# Patient Record
Sex: Female | Born: 1980 | Marital: Married | Smoking: Never smoker
Health system: Southern US, Community
[De-identification: ages and names within clinical notes are randomized; demographics above are authoritative.]

## PROBLEM LIST (undated history)

## (undated) DIAGNOSIS — J45909 Unspecified asthma, uncomplicated: Secondary | ICD-10-CM

## (undated) DIAGNOSIS — Z8619 Personal history of other infectious and parasitic diseases: Secondary | ICD-10-CM

## (undated) HISTORY — DX: Personal history of other infectious and parasitic diseases: Z86.19

---

## 2009-01-29 ENCOUNTER — Observation Stay: Payer: Self-pay | Admitting: Obstetrics and Gynecology

## 2009-01-30 ENCOUNTER — Emergency Department: Payer: Self-pay | Admitting: Emergency Medicine

## 2009-02-05 ENCOUNTER — Ambulatory Visit: Payer: Self-pay | Admitting: Family Medicine

## 2009-02-19 ENCOUNTER — Encounter: Payer: Self-pay | Admitting: Obstetrics and Gynecology

## 2009-03-05 ENCOUNTER — Encounter: Payer: Self-pay | Admitting: Obstetrics & Gynecology

## 2009-03-19 ENCOUNTER — Encounter: Payer: Self-pay | Admitting: Obstetrics and Gynecology

## 2009-03-26 ENCOUNTER — Other Ambulatory Visit: Payer: Self-pay | Admitting: Obstetrics & Gynecology

## 2009-06-05 ENCOUNTER — Inpatient Hospital Stay: Payer: Self-pay

## 2010-12-02 IMAGING — US ULTRAOUND OB LIMITED - NRPT [facility]
1 series · 11 of 11 positions shown · non-contrast
Comparison: none

[Series 1: ultraound ob limited - [facility]pt [facility] · 11 of 11 slices shown]
[im 1/11]
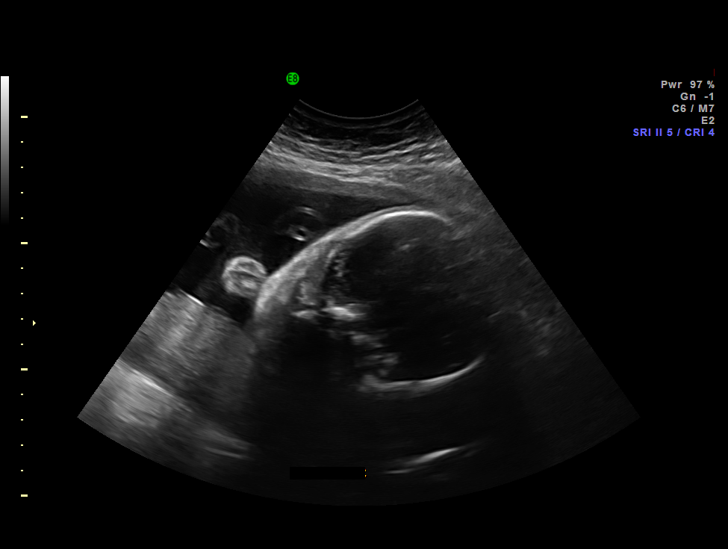
[im 2/11]
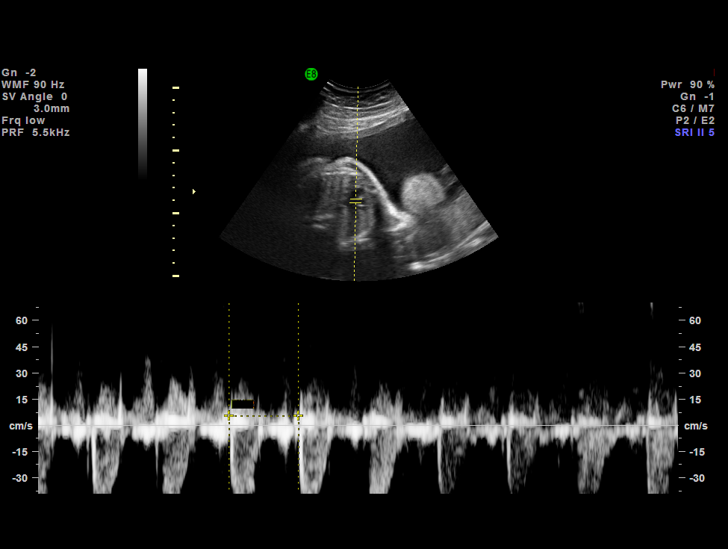
[im 3/11]
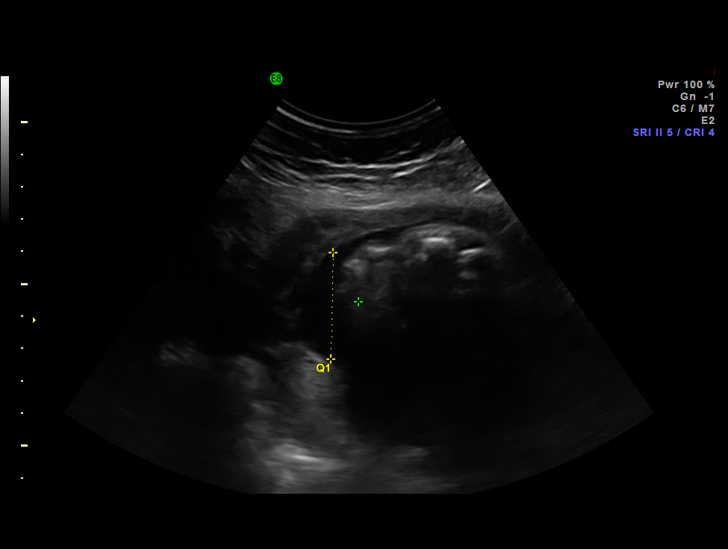
[im 4/11]
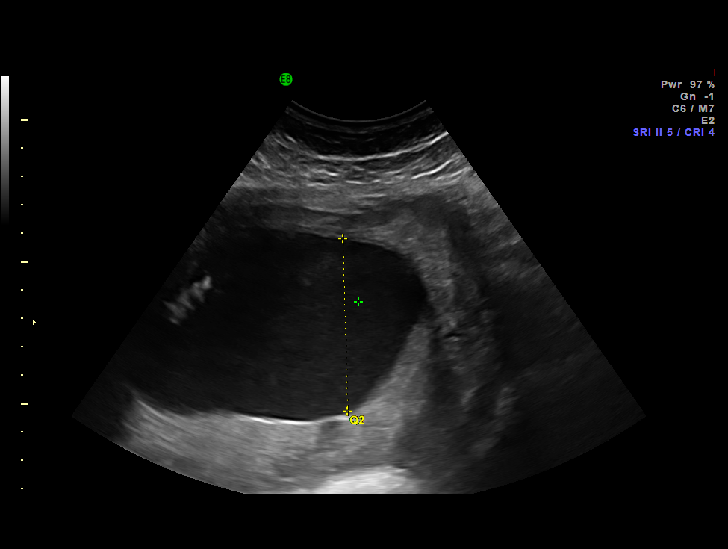
[im 5/11]
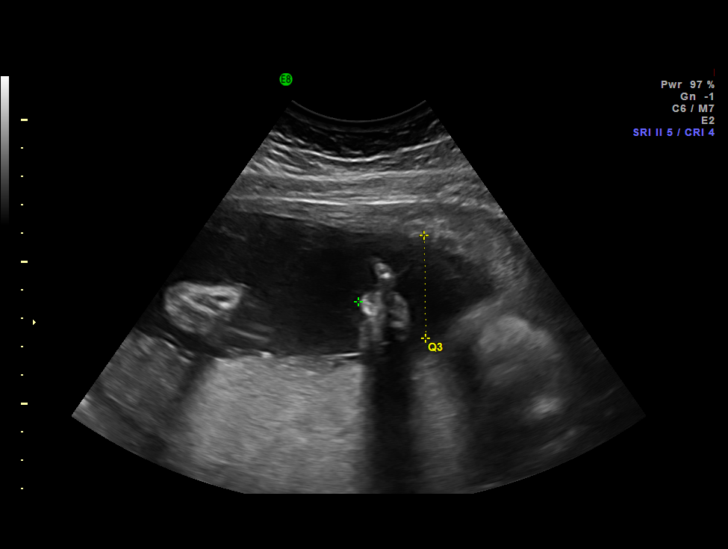
[im 6/11]
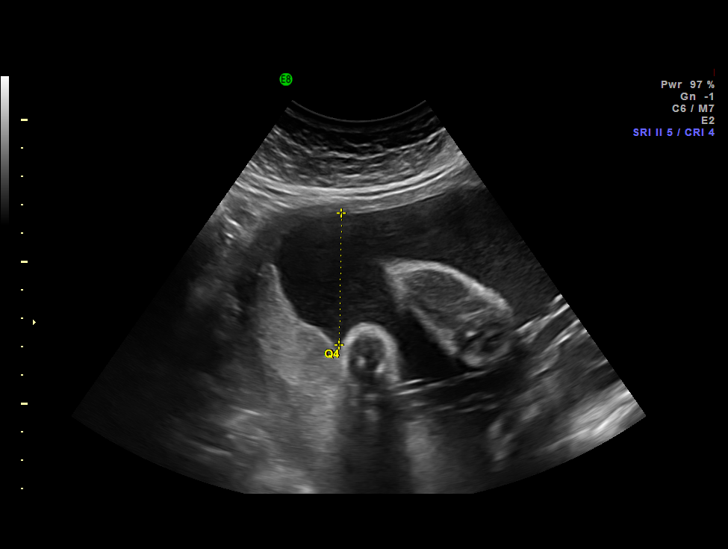
[im 7/11]
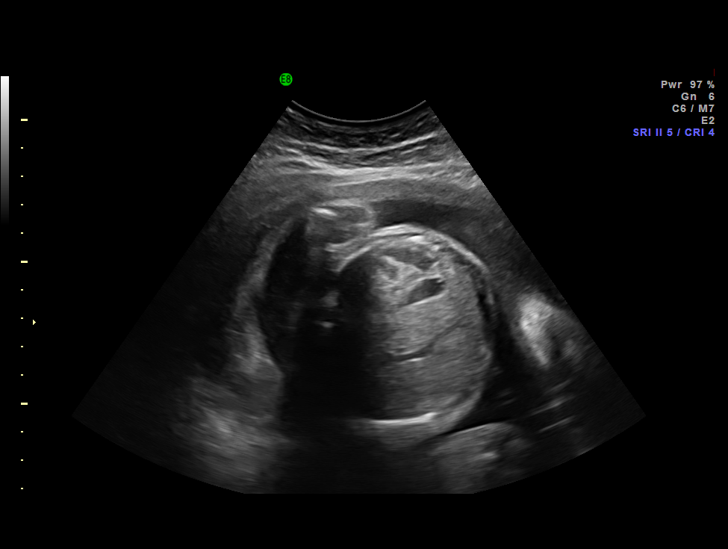
[im 8/11]
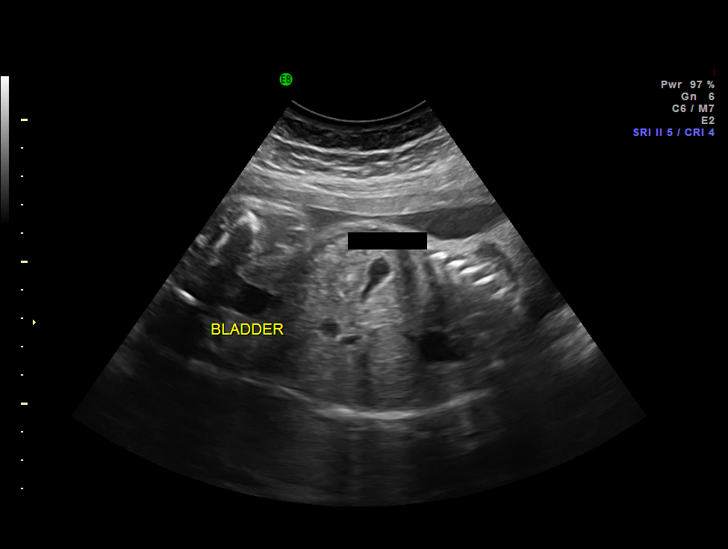
[im 9/11]
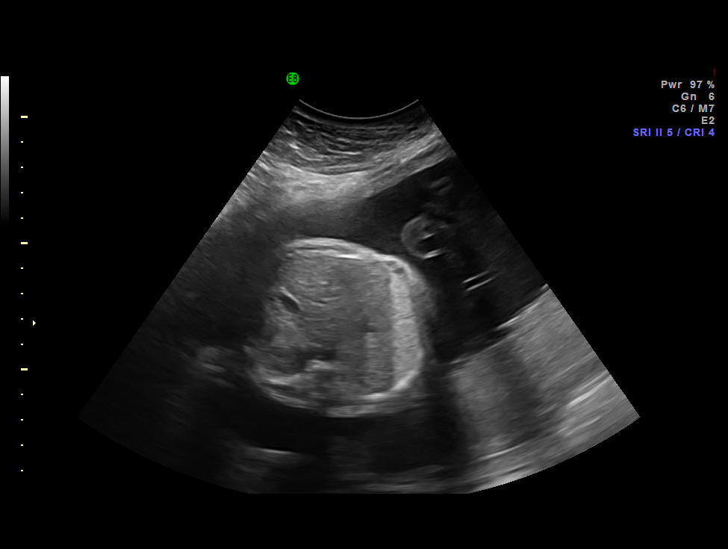
[im 10/11]
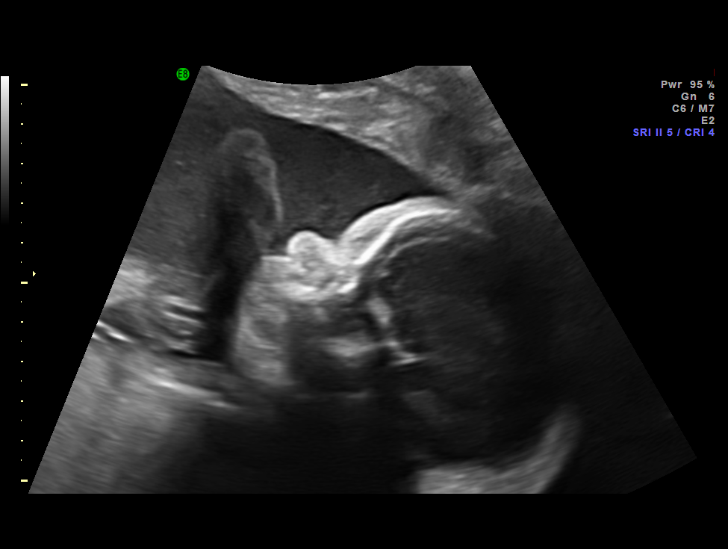
[im 11/11]
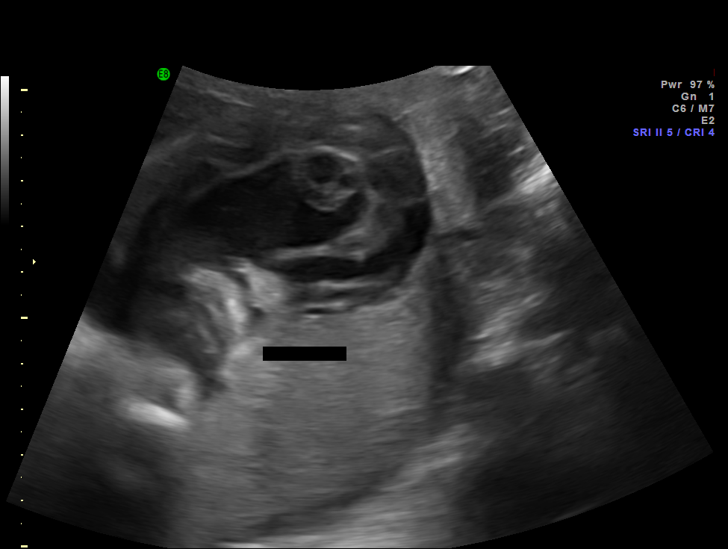

[11 of 11 positions shown; findings below may reference images not displayed]

IMAGES IMPORTED FROM THE [employer] WORKFLOW SYSTEM
NO DICTATION FOR STUDY

## 2015-06-21 ENCOUNTER — Other Ambulatory Visit: Payer: Self-pay | Admitting: Family Medicine

## 2015-07-22 ENCOUNTER — Other Ambulatory Visit: Payer: Self-pay | Admitting: Family Medicine

## 2015-10-30 ENCOUNTER — Ambulatory Visit (INDEPENDENT_AMBULATORY_CARE_PROVIDER_SITE_OTHER): Payer: No Typology Code available for payment source | Admitting: Family Medicine

## 2015-10-30 ENCOUNTER — Encounter: Payer: Self-pay | Admitting: Emergency Medicine

## 2015-10-30 ENCOUNTER — Encounter: Payer: Self-pay | Admitting: Family Medicine

## 2015-10-30 VITALS — BP 138/88 | HR 108 | Temp 98.2°F | Resp 18 | Wt 199.0 lb

## 2015-10-30 DIAGNOSIS — J309 Allergic rhinitis, unspecified: Secondary | ICD-10-CM | POA: Insufficient documentation

## 2015-10-30 DIAGNOSIS — R5383 Other fatigue: Secondary | ICD-10-CM

## 2015-10-30 DIAGNOSIS — R1013 Epigastric pain: Secondary | ICD-10-CM

## 2015-10-30 DIAGNOSIS — R55 Syncope and collapse: Secondary | ICD-10-CM | POA: Diagnosis not present

## 2015-10-30 DIAGNOSIS — J3089 Other allergic rhinitis: Secondary | ICD-10-CM

## 2015-10-30 DIAGNOSIS — J45909 Unspecified asthma, uncomplicated: Secondary | ICD-10-CM | POA: Insufficient documentation

## 2015-10-30 MED ORDER — OMEPRAZOLE 20 MG PO CPDR: 20.0000 mg | DELAYED_RELEASE_CAPSULE | Freq: Every day | ORAL | Status: DC

## 2015-10-30 MED ORDER — FLUTICASONE PROPIONATE 50 MCG/ACT NA SUSP: 2.0000 | Freq: Every day | NASAL | Status: DC

## 2015-10-30 NOTE — Progress Notes (Signed)
Patient ID: Olivia Maddox, female   DOB: 04-13-1981, 34 y.o.   MRN: [id number]       Patient: Olivia Maddox Female    DOB: 10/26/1981   34 y.o.   MRN: [id number] Visit Date: 10/30/2015  Today's Provider: Mila Merry, MD   Chief Complaint  Patient presents with  . Dizziness    Near syncope episode 2 days ago.  . Abdominal Pain   Subjective:    HPI Pt reports that this past Sunday she was at a Holiday party when she suddenly felt nauseous and nearly passed out. She was not completely unscience, she could hear people talking and could see ans talk to them. She was very dizzy and could not stand up. She has since been having pain in her upper abdomen. She reports that she feels like she has acid reflux going up her throat. Denies any chest pain or shortness of breath. After the Episode she had a headache but does not have one now. She states she had been taking OTC cold and flu medications for the past few weeks, and took 2 benadryl before going to the party on the day this episode.  Her husband also states she had not eaten nor had much to drink prior to the episode.   Epigastric pain She states she has been having intermittent epigastric and left upper quadrant pain for several months. She also gets a frequent acid taste in her moth with burning in her chest that is relieved with OTC antacids, which she takes most days     Not on File Previous Medications   FLUTICASONE PROPIONATE, INHAL, (FLOVENT DISKUS) 250 MCG/BLIST AEPB    Inhale into the lungs 2 (two) times daily. Reported on 10/30/2015   VENTOLIN HFA 108 (90 BASE) MCG/ACT INHALER    INHALE TWO PUFFS BY MOUTH EVERY 4 TO 6 HOURS AS NEEDED FOR WHEEZING    Review of Systems  Constitutional: Positive for fatigue.  Eyes: Negative.   Respiratory: Negative.   Cardiovascular: Negative.   Gastrointestinal: Positive for nausea and abdominal pain.  Endocrine: Negative.   Genitourinary: Negative.   Musculoskeletal:  Negative.   Skin: Negative.   Allergic/Immunologic: Negative.   Neurological: Positive for dizziness, syncope (pre-syncope), weakness and headaches.  Hematological: Negative.   Psychiatric/Behavioral: Negative.     Social History  Substance Use Topics  . Smoking status: Never Smoker   . Smokeless tobacco: Not on file  . Alcohol Use: No   Objective:   BP 138/88 mmHg  Pulse 108  Temp(Src) 98.2 F (36.8 C) (Oral)  Resp 18  Wt 199 lb (90.266 kg)  SpO2 99%  Physical Exam   General Appearance:    Alert, cooperative, no distress  Eyes:    PERRL, conjunctiva/corneas clear, EOM's intact       Lungs:     Clear to auscultation bilaterally, respirations unlabored  Heart:    Regular rate and rhythm. No murmurs. No carotid bruits.   Neurologic:   Awake, alert, oriented x 3. No apparent focal neurological           defect.   Abd:     Mild epigastric tenderness, and tenderness just or left of epigastrium No masses. Normal bowel sounds.       Assessment & Plan:     1. Near syncope Suspect this was multifactorial and likely exacerbated by taking benadryl when she had not had anything to ear or drink for several hours.  - EKG 12-Lead  2.  Other fatigue  - Comprehensive metabolic panel - CBC - TSH  3. Other allergic rhinitis Is to stop taking OTC antihistamines and start steroid nasal spray - fluticasone (FLONASE) 50 MCG/ACT nasal spray; Place 2 sprays into both nostrils daily.  Dispense: 16 g; Refill: 6  4. Epigastric pain Chronic and getting worse. Suspect gastritis or duodenitis.  - omeprazole (PRILOSEC) 20 MG capsule; Take 1 capsule (20 mg total) by mouth daily. For acid reflux  Dispense: 30 capsule; Refill: 2 - Amylase - H Pylori, IGM, IGG, IGA AB       Mila Merryonald Lyndy Russman, MD  [facility]

## 2015-10-31 ENCOUNTER — Encounter: Payer: Self-pay | Admitting: Family Medicine

## 2015-11-01 ENCOUNTER — Telehealth: Payer: Self-pay | Admitting: *Deleted

## 2015-11-01 LAB — H PYLORI, IGM, IGG, IGA AB
H PYLORI IGG: 5.7 U/mL — AB (ref 0.0–0.8)
H pylori, IgM Abs: <9 units (ref 0.0–8.9)
H. pylori, IgA Abs: 15.1 units — ABNORMAL HIGH (ref 0.0–8.9)

## 2015-11-01 LAB — CBC
HEMATOCRIT: 36.7 % (ref 34.0–46.6)
Hemoglobin: 12.7 g/dL (ref 11.1–15.9)
MCH: 27.7 pg (ref 26.6–33.0)
MCHC: 34.6 g/dL (ref 31.5–35.7)
MCV: 80 fL (ref 79–97)
PLATELETS: 353 10*3/uL (ref 150–379)
RBC: 4.58 x10E6/uL (ref 3.77–5.28)
RDW: 13.8 % (ref 12.3–15.4)
WBC: 11.3 10*3/uL — AB (ref 3.4–10.8)

## 2015-11-01 LAB — COMPREHENSIVE METABOLIC PANEL
A/G RATIO: 1.1 (ref 1.1–2.5)
ALBUMIN: 4.1 g/dL (ref 3.5–5.5)
ALK PHOS: 115 IU/L (ref 39–117)
ALT: 12 IU/L (ref 0–32)
AST: 16 IU/L (ref 0–40)
BILIRUBIN TOTAL: 0.3 mg/dL (ref 0.0–1.2)
BUN / CREAT RATIO: 15 (ref 8–20)
BUN: 8 mg/dL (ref 6–20)
CHLORIDE: 96 mmol/L (ref 96–106)
CO2: 20 mmol/L (ref 18–29)
Calcium: 9.7 mg/dL (ref 8.7–10.2)
Creatinine, Ser: 0.52 mg/dL — ABNORMAL LOW (ref 0.57–1.00)
GFR calc non Af Amer: 125 mL/min/{1.73_m2} (ref >59)
GFR, EST AFRICAN AMERICAN: 144 mL/min/{1.73_m2} (ref >59)
GLOBULIN, TOTAL: 3.9 g/dL (ref 1.5–4.5)
Glucose: 97 mg/dL (ref 65–99)
Potassium: 4.2 mmol/L (ref 3.5–5.2)
SODIUM: 135 mmol/L (ref 134–144)
TOTAL PROTEIN: 8 g/dL (ref 6.0–8.5)

## 2015-11-01 LAB — AMYLASE: Amylase: 51 U/L (ref 31–124)

## 2015-11-01 LAB — TSH: TSH: 1.26 u[IU]/mL (ref 0.450–4.500)

## 2015-11-01 MED ORDER — CLARITHROMYCIN 500 MG PO TABS: 500.0000 mg | ORAL_TABLET | Freq: Two times a day (BID) | ORAL | Status: DC

## 2015-11-01 MED ORDER — AMOXICILLIN 500 MG PO CAPS: 1000.0000 mg | ORAL_CAPSULE | Freq: Two times a day (BID) | ORAL | Status: DC

## 2015-11-01 NOTE — Telephone Encounter (Signed)
Pt is returning call.  MV#[phone]/[id number]/MW

## 2015-11-01 NOTE — Telephone Encounter (Signed)
Patient was notified of results. Patient expressed understanding. Rx's were sent to pharmacy. 

## 2015-11-01 NOTE — Telephone Encounter (Signed)
-----   Message from Malva Limesonald E Fisher, MD sent at 11/01/2015  7:41 AM EST ----- Labs indicated she has h.pylori which is probably the cause of her stomach pains. Otherwise labs are normal. Need to start amoxicillin 500mg  2 twice a day (#40),  Clarithromycin 500mg  1 twice a day (#10), and take the omeprazole 20mg  that was prescribed at [facility]. TWICE a day instead of once a day.  Need follow up o.v. In 2 weeks.  I think the episode where she almost passed out was a combination of the benadryl and low blood sugar and she should avoid taking benadryl for the time being.

## 2015-11-27 ENCOUNTER — Ambulatory Visit
Admission: RE | Admit: 2015-11-27 | Discharge: 2015-11-27 | Disposition: A | Discharge status: Home / Self Care | Payer: BLUE CROSS/BLUE SHIELD | Source: Ambulatory Visit | Attending: Family Medicine | Admitting: Family Medicine

## 2015-11-27 ENCOUNTER — Ambulatory Visit (INDEPENDENT_AMBULATORY_CARE_PROVIDER_SITE_OTHER): Payer: BLUE CROSS/BLUE SHIELD | Admitting: Family Medicine

## 2015-11-27 VITALS — BP 120/86 | HR 100 | Temp 97.8°F | Resp 18 | Wt 195.0 lb

## 2015-11-27 DIAGNOSIS — M542 Cervicalgia: Secondary | ICD-10-CM | POA: Insufficient documentation

## 2015-11-27 DIAGNOSIS — S0302XA Dislocation of jaw, left side, initial encounter: Secondary | ICD-10-CM

## 2015-11-27 DIAGNOSIS — B9681 Helicobacter pylori [H. pylori] as the cause of diseases classified elsewhere: Secondary | ICD-10-CM | POA: Insufficient documentation

## 2015-11-27 DIAGNOSIS — S0300XA Dislocation of jaw, unspecified side, initial encounter: Secondary | ICD-10-CM | POA: Insufficient documentation

## 2015-11-27 DIAGNOSIS — K2970 Gastritis, unspecified, without bleeding: Principal | ICD-10-CM

## 2015-11-27 MED ORDER — CELECOXIB 100 MG PO CAPS: 100.0000 mg | ORAL_CAPSULE | Freq: Two times a day (BID) | ORAL | Status: DC | PRN

## 2015-11-27 NOTE — Progress Notes (Signed)
       Patient: Olivia Maddox Female    DOB: 03/01/1981   34 y.o.   MRN: [id number] Visit Date: 11/27/2015  Today's Provider: Mila Merry, MD   Chief Complaint  Patient presents with  . Follow-up    H. Pylori infection follow up   Subjective:    HPI H. Pylori infection follow up:  Patient was last seen 10/30/2015. Patient came in for symptoms of epigastric pain. Labs were ordered showing positive for H. Pylori infection. Patient was treated with Amoxicillin, Clarithromycin and Omeprazole.  Today patient comes in stating she has completed all doses of medication. She reports the epigastric pain has resolved but she still has heart burn.   Patient has also been having nack pain and jaw pain, and feels a click in her right jaw when she chews gum.       Not on File Previous Medications   FLUTICASONE (FLONASE) 50 MCG/ACT NASAL SPRAY    Place 2 sprays into both nostrils daily.   FLUTICASONE PROPIONATE, INHAL, (FLOVENT DISKUS) 250 MCG/BLIST AEPB    Inhale into the lungs 2 (two) times daily. Reported on 10/30/2015   IBUPROFEN (ADVIL,MOTRIN) 200 MG TABLET    Take 200 mg by mouth every 6 (six) hours as needed.   OMEPRAZOLE (PRILOSEC) 20 MG CAPSULE    Take 1 capsule (20 mg total) by mouth daily. For acid reflux   VENTOLIN HFA 108 (90 BASE) MCG/ACT INHALER    INHALE TWO PUFFS BY MOUTH EVERY 4 TO 6 HOURS AS NEEDED FOR WHEEZING    Review of Systems  Constitutional: Negative for fever, chills, appetite change and fatigue.  HENT: Negative for ear discharge.   Respiratory: Negative for chest tightness and shortness of breath.   Cardiovascular: Negative for chest pain and palpitations.  Gastrointestinal: Negative for nausea, vomiting and abdominal pain.       Reflux  Musculoskeletal: Positive for neck pain.  Neurological: Positive for headaches. Negative for dizziness and weakness.    Social History  Substance Use Topics  . Smoking status: Never Smoker   . Smokeless tobacco: Not  on file  . Alcohol Use: No   Objective:   BP 120/86 mmHg  Pulse 100  Temp(Src) 97.8 F (36.6 C) (Oral)  Resp 18  Wt 195 lb (88.451 kg)  SpO2 100%  Physical Exam  General appearance: alert, well developed, well nourished, cooperative and in no distress Head: Normocephalic, without obvious abnormality, atraumatic. FROM of neck  Lungs: Respirations even and unlabored Extremities: No gross deformities. Abd: Non tender no masses.      Assessment & Plan:     1. Helicobacter pylori gastritis Finished triple therapy. Will d TOC - H. pylori breath test  2. Neck pain Having some worsening reflyx symptoms on ibuprofen. Will try COX-2 inhibitor. - DG Cervical Spine Complete; Future - celecoxib (CELEBREX) 100 MG capsule; Take 1 capsule (100 mg total) by mouth 2 (two) times daily as needed. For neck pain  Dispense: 30 capsule; Refill: 1  3. TMJ (dislocation of temporomandibular joint), initial encounter         Mila Merry, MD  [facility]

## 2015-11-29 ENCOUNTER — Ambulatory Visit
Admission: RE | Admit: 2015-11-29 | Discharge: 2015-11-29 | Disposition: A | Discharge status: Home / Self Care | Payer: BLUE CROSS/BLUE SHIELD | Source: Ambulatory Visit | Attending: Family Medicine | Admitting: Family Medicine

## 2015-11-29 DIAGNOSIS — M542 Cervicalgia: Secondary | ICD-10-CM | POA: Diagnosis present

## 2015-11-30 ENCOUNTER — Telehealth: Payer: Self-pay | Admitting: Family Medicine

## 2015-11-30 NOTE — Telephone Encounter (Signed)
Patient advised of results and verbally voiced understanding.  

## 2015-11-30 NOTE — Telephone Encounter (Signed)
Pt calling wanting to know about her X-ray Results from yesterday (11-29-15), pt states she would like a nurse to return her call @ [phone].  Thanks CC

## 2015-12-01 LAB — H. PYLORI BREATH TEST: H. pylori UBiT: POSITIVE — AB

## 2015-12-03 ENCOUNTER — Telehealth: Payer: Self-pay

## 2015-12-03 MED ORDER — METRONIDAZOLE 250 MG PO TABS: 250.0000 mg | ORAL_TABLET | Freq: Four times a day (QID) | ORAL | Status: DC

## 2015-12-03 MED ORDER — BISMUTH SUBSALICYLATE 262 MG PO TABS: 2.0000 | ORAL_TABLET | Freq: Four times a day (QID) | ORAL | Status: DC

## 2015-12-03 MED ORDER — OMEPRAZOLE MAGNESIUM 20 MG PO TBEC: 20.0000 mg | DELAYED_RELEASE_TABLET | Freq: Every day | ORAL | Status: DC

## 2015-12-03 MED ORDER — DOXYCYCLINE HYCLATE 100 MG PO CAPS: 100.0000 mg | ORAL_CAPSULE | Freq: Two times a day (BID) | ORAL | Status: DC

## 2015-12-03 NOTE — Telephone Encounter (Signed)
Unable to leave VM due to not being set up. Will try again later.

## 2015-12-03 NOTE — Telephone Encounter (Signed)
-----   Message from Malva Limes, MD sent at 12/02/2015  9:27 PM EST ----- H. Pylori test is positive. Need to take a more aggressive treatment: RX metronidazole  four times a day for 14 days #56 OTC Pepto Bismol  2 tablets four times a day for 14 day Rx doxycycline  twice a day for 14 days And Prilosec OTC  twice a day for 14 day Follow up 2-3 weeks to retest.  RX  doxycyline  twice a day. OTC peptobismo tablets 2 tablet four times a day and Prilosec  twice a day

## 2015-12-03 NOTE — Telephone Encounter (Signed)
Patient advised as below. Meds sent into pharmacy. Patient will call back to make appt due to husbands schedule.

## 2015-12-24 ENCOUNTER — Other Ambulatory Visit: Payer: Self-pay | Admitting: Family Medicine

## 2015-12-24 NOTE — Telephone Encounter (Signed)
Pt needs refill VENTOLIN HFA 108 (90 Base) MCG/ACT inhaler  [facility] [address]  Thanks, Barth Kirks

## 2015-12-24 NOTE — Telephone Encounter (Signed)
This rx has already been sent.

## 2015-12-27 ENCOUNTER — Ambulatory Visit (INDEPENDENT_AMBULATORY_CARE_PROVIDER_SITE_OTHER): Payer: BLUE CROSS/BLUE SHIELD | Admitting: Family Medicine

## 2015-12-27 ENCOUNTER — Encounter: Payer: Self-pay | Admitting: Family Medicine

## 2015-12-27 VITALS — BP 126/90 | HR 91 | Temp 97.6°F | Resp 16 | Wt 192.0 lb

## 2015-12-27 DIAGNOSIS — M542 Cervicalgia: Secondary | ICD-10-CM | POA: Diagnosis not present

## 2015-12-27 DIAGNOSIS — K219 Gastro-esophageal reflux disease without esophagitis: Secondary | ICD-10-CM | POA: Diagnosis not present

## 2015-12-27 DIAGNOSIS — H9311 Tinnitus, right ear: Secondary | ICD-10-CM

## 2015-12-27 DIAGNOSIS — K2970 Gastritis, unspecified, without bleeding: Secondary | ICD-10-CM

## 2015-12-27 DIAGNOSIS — J45909 Unspecified asthma, uncomplicated: Secondary | ICD-10-CM

## 2015-12-27 DIAGNOSIS — B9681 Helicobacter pylori [H. pylori] as the cause of diseases classified elsewhere: Secondary | ICD-10-CM | POA: Diagnosis not present

## 2015-12-27 MED ORDER — NEOMYCIN-POLYMYXIN-HC 3.5-10000-1 OT SOLN: 3.0000 [drp] | Freq: Four times a day (QID) | OTIC | Status: AC

## 2015-12-27 MED ORDER — FLUTICASONE PROPIONATE (INHAL) 250 MCG/BLIST IN AEPB: 2.0000 | INHALATION_SPRAY | Freq: Two times a day (BID) | RESPIRATORY_TRACT | Status: DC

## 2015-12-27 NOTE — Progress Notes (Signed)
Patient: Olivia Maddox Female    DOB: 08/31/1981   34 y.o.   MRN: [id number] Visit Date: 12/27/2015  Today's Provider: Mila Merry, MD   Chief Complaint  Patient presents with  . Follow-up    H. Pylori infection   Subjective:    HPI Follow up H. Pylori infection:  Last office visit was 11/27/2015. H. Pylori test was done and still tested positive. Patient was started on a more aggressive treatment; Metronidazole, OTC Pepto bismol, Doxycycline and Prilosec. Patient is here today to follow up and retest.  Patient feels that her symptoms have improved but she still has nausea.    Neck pain She had normal xrays in January and was prescribed Celebrex. Due to insurance she just got prescription and started on Celebrex yesterday. 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   Asthma She has been having to use albuterol most days for the last few months. She has been of her maintenance inhaler which she does not like to take because she doesn't like to take medications. '  Tinnitus She reports frequent ringing in her ears, worse on the right, for a long time. She asks if she needs an MRI to have this evaluated. She also complains if frequent itching and irritation in her ears, but no ear pain.     No Known Allergies Previous Medications   CELECOXIB (CELEBREX) 100 MG CAPSULE    Take 1 capsule (100 mg total) by mouth 2 (two) times daily as needed. For neck pain   FLUTICASONE (FLONASE) 50 MCG/ACT NASAL SPRAY    Place 2 sprays into both nostrils daily.   FLUTICASONE PROPIONATE, INHAL, (FLOVENT DISKUS) 250 MCG/BLIST AEPB    Inhale into the lungs 2 (two) times daily. Reported on 10/30/2015   VENTOLIN HFA 108 (90 BASE) MCG/ACT INHALER    INHALE 2 PUFFS EVERY 4 TO 6 HOURS AS NEEDED FOR WHEEZING    Review of Systems  Constitutional: Negative for fever, chills, appetite change and fatigue.  HENT: Positive for ear pain (has pain and itching in  both ears; worse in right ear).   Respiratory: Negative for chest tightness and shortness of breath.   Cardiovascular: Negative for chest pain and palpitations.  Gastrointestinal: Positive for nausea. Negative for vomiting and abdominal pain.  Neurological: Negative for dizziness and weakness.    Social History  Substance Use Topics  . Smoking status: Never Smoker   . Smokeless tobacco: Not on file  . Alcohol Use: No   Objective:   BP 126/90 mmHg  Pulse 91  Temp(Src) 97.6 F (36.4 C) (Oral)  Resp 16  Wt 192 lb (87.091 kg)  SpO2 99%  LMP 11/28/2015  Physical Exam  General Appearance:    Alert, cooperative, no distress  HENT:   Ear canals coated with dried wax. TMs normal. No bleeding  Lungs:   CTA       Assessment & Plan:     1. Helicobacter pylori gastritis Failed first line treatment of amoxicillin, clarithromycin and omeprazole. Has now completed second line therapy with metronidazole, doxy, pepto-bismo, and omeprazole. She is still a little nauseated after eating, but also describes some symptoms more c/w reflux. Recheck h. Pylori breast test. Consider getting back on PPI due to GERD symptoms.   2. Tinnitus of right ear  - Ambulatory referral to ENT  3. Neck pain Normal xray c-spine. Just started Celebrex in the last  24 hours. No OTC NSAIDs due to GI symptoms. Consider PT if not improving on Celebrex  4. Gastroesophageal reflux disease without esophagitis   5. Asthma, unspecified asthma severity, uncomplicated Counseled on importance of using maintenance inhaler Start back on Flovent and continue prn albuterol.   - Fluticasone Propionate, Inhal, (FLOVENT DISKUS) 250 MCG/BLIST AEPB; Inhale 2 puffs into the lungs 2 (two) times daily. Reported on 10/30/2015  Dispense: 60 each; Refill: 5        Mila Merry, MD  [facility]

## 2015-12-27 NOTE — Patient Instructions (Addendum)
   Use a cervical pillow when sleeping to help support your neck.   Call if your neck is not feeling better after taking the Celebrex twice a day for the next week.

## 2015-12-30 LAB — H. PYLORI BREATH TEST: H. PYLORI UBIT: NEGATIVE

## 2015-12-31 ENCOUNTER — Telehealth: Payer: Self-pay

## 2015-12-31 ENCOUNTER — Other Ambulatory Visit: Payer: Self-pay | Admitting: Family Medicine

## 2015-12-31 NOTE — Telephone Encounter (Signed)
-----   Message from Malva Limes, MD sent at 12/30/2015  9:04 PM EST ----- H. Pylori test is negative. No more antibiotics are needed.

## 2015-12-31 NOTE — Telephone Encounter (Signed)
Tried calling patient, and VM has not been set up yet. Will try again later.

## 2016-01-01 ENCOUNTER — Telehealth: Payer: Self-pay | Admitting: Family Medicine

## 2016-01-01 NOTE — Telephone Encounter (Signed)
Pt is wanting to get results from H Pylori test

## 2016-01-01 NOTE — Telephone Encounter (Signed)
Patient was notified of results. Patient expressed understanding. 

## 2016-01-01 NOTE — Telephone Encounter (Signed)
Patient returned call. Tried calling patient, and VM has not been set up yet. Will try again later.

## 2016-01-01 NOTE — Telephone Encounter (Signed)
See other message

## 2016-01-15 ENCOUNTER — Other Ambulatory Visit: Payer: Self-pay | Admitting: Otolaryngology

## 2016-01-15 DIAGNOSIS — H9201 Otalgia, right ear: Secondary | ICD-10-CM

## 2016-01-15 DIAGNOSIS — M542 Cervicalgia: Secondary | ICD-10-CM

## 2016-01-18 ENCOUNTER — Ambulatory Visit: Admission: RE | Admit: 2016-01-18 | Payer: BLUE CROSS/BLUE SHIELD | Source: Ambulatory Visit

## 2016-04-08 ENCOUNTER — Other Ambulatory Visit: Payer: Self-pay | Admitting: Obstetrics and Gynecology

## 2016-04-08 DIAGNOSIS — Z369 Encounter for antenatal screening, unspecified: Secondary | ICD-10-CM

## 2016-04-08 LAB — OB RESULTS CONSOLE VARICELLA ZOSTER ANTIBODY, IGG: VARICELLA IGG: IMMUNE

## 2016-04-08 LAB — OB RESULTS CONSOLE HIV ANTIBODY (ROUTINE TESTING): HIV: NONREACTIVE

## 2016-04-08 LAB — OB RESULTS CONSOLE RUBELLA ANTIBODY, IGM: RUBELLA: IMMUNE

## 2016-04-08 LAB — OB RESULTS CONSOLE HEPATITIS B SURFACE ANTIGEN: Hepatitis B Surface Ag: NEGATIVE

## 2016-04-14 ENCOUNTER — Ambulatory Visit (HOSPITAL_BASED_OUTPATIENT_CLINIC_OR_DEPARTMENT_OTHER)
Admission: RE | Admit: 2016-04-14 | Discharge: 2016-04-14 | Disposition: A | Discharge status: Home / Self Care | Payer: BLUE CROSS/BLUE SHIELD | Source: Ambulatory Visit | Attending: Obstetrics & Gynecology | Admitting: Obstetrics & Gynecology

## 2016-04-14 ENCOUNTER — Ambulatory Visit
Admission: RE | Admit: 2016-04-14 | Discharge: 2016-04-14 | Disposition: A | Discharge status: Home / Self Care | Payer: BLUE CROSS/BLUE SHIELD | Source: Ambulatory Visit | Attending: Obstetrics & Gynecology | Admitting: Obstetrics & Gynecology

## 2016-04-14 VITALS — HR 107 | Temp 98.4°F | Resp 17 | Ht 69.0 in | Wt 190.0 lb

## 2016-04-14 DIAGNOSIS — Z36 Encounter for antenatal screening of mother: Secondary | ICD-10-CM | POA: Diagnosis present

## 2016-04-14 DIAGNOSIS — Z3A12 12 weeks gestation of pregnancy: Secondary | ICD-10-CM | POA: Insufficient documentation

## 2016-04-14 DIAGNOSIS — O09511 Supervision of elderly primigravida, first trimester: Secondary | ICD-10-CM | POA: Insufficient documentation

## 2016-04-14 DIAGNOSIS — O09521 Supervision of elderly multigravida, first trimester: Secondary | ICD-10-CM

## 2016-04-14 DIAGNOSIS — Z369 Encounter for antenatal screening, unspecified: Secondary | ICD-10-CM

## 2016-04-14 DIAGNOSIS — O09529 Supervision of elderly multigravida, unspecified trimester: Secondary | ICD-10-CM | POA: Insufficient documentation

## 2016-04-14 HISTORY — DX: Unspecified asthma, uncomplicated: J45.909

## 2016-04-14 NOTE — Progress Notes (Signed)
I have reviewed the note and agree with the findings and plan.  Janeann ForehandS. Laakea Pereira, MD

## 2016-04-14 NOTE — Progress Notes (Signed)
[facility] Length of Consultation: 45 minutes  Olivia Maddox [facility] because of advanced maternal age.  The patient will be 35 years old at the time of delivery.  This note summarizes the information we discussed.    We explained that the chance of a chromosome abnormality increases with maternal age.  Chromosomes and examples of chromosome problems were reviewed.  Humans typically have 46 chromosomes in each cell, with half passed through each sperm and egg.  Any change in the number or structure of chromosomes can increase the risk of problems in the physical and mental development of a pregnancy.   Based upon age of the patient and the current gestational age, the chance of any chromosome abnormality was 1 in 68114. The chance of Down syndrome, the most common chromosome problem associated with maternal age, was 1 in 31238.  The risk of chromosome problems is in addition to the 3% general population risk for birth defects and mental retardation.  The greatest chance, of course, is that the baby would be born in good health.  We discussed the following prenatal screening and testing options for this pregnancy:  First trimester screening, which includes nuchal translucency ultrasound screen and first trimester maternal serum marker screening.  The nuchal translucency has approximately an 80% detection rate for Down syndrome and can be positive for other chromosome abnormalities as well as heart defects.  When combined with a maternal serum marker screening, the detection rate is up to 90% for Down syndrome and up to 97% for trisomy 18.     The chorionic villus sampling procedure is available for first trimester chromosome analysis.  This involves the withdrawal of a small amount of chorionic villi (tissue from the developing placenta).  Risk of pregnancy loss is estimated to be approximately 1 in 200 to 1 in  100 (0.5 to 1%).  There is approximately a 1% (1 in 100) chance that the CVS chromosome results will be unclear.  Chorionic villi cannot be tested for neural tube defects.     Maternal serum marker screening, a blood test that measures pregnancy proteins, can provide risk assessments for Down syndrome, trisomy 18, and open neural tube defects (spina bifida, anencephaly). Because it does not directly examine the fetus, it cannot positively diagnose or rule out these problems.  Targeted ultrasound uses high frequency sound waves to create an image of the developing fetus.  An ultrasound is often recommended as a routine means of evaluating the pregnancy.  It is also used to screen for fetal anatomy problems (for example, a heart defect) that might be suggestive of a chromosomal or other abnormality.   Amniocentesis involves the removal of a small amount of amniotic fluid from the sac surrounding the fetus with the use of a thin needle inserted through the maternal abdomen and uterus.  Ultrasound guidance is used throughout the procedure.  Fetal cells from amniotic fluid are directly evaluated and > 99.5% of chromosome problems and > 98% of open neural tube defects can be detected. This procedure is generally performed after the 15th week of pregnancy.  The main risks to this procedure include complications leading to miscarriage in less than 1 in 200 cases (0.5%).  We also reviewed the availability of cell free fetal DNA testing from maternal blood to determine whether or not the baby may have either Down syndrome, trisomy 3713, or trisomy 2118.  This test utilizes a maternal  blood sample and DNA sequencing technology to isolate circulating cell free fetal DNA from maternal plasma.  The fetal DNA can then be analyzed for DNA sequences that are derived from the three most common chromosomes involved in aneuploidy, chromosomes 13, 18, and 21.  If the overall amount of DNA is greater than the expected level for any  of these chromosomes, aneuploidy is suspected.  While we do not consider it a replacement for invasive testing and karyotype analysis, a negative result from this testing would be reassuring, though not a guarantee of a normal chromosome complement for the baby.  An abnormal result is certainly suggestive of an abnormal chromosome complement, though we would still recommend CVS or amniocentesis to confirm any findings from this testing.  Cystic Fibrosis and Spinal Muscular Atrophy (SMA) screening were also discussed with the patient. Both conditions are recessive, which means that both parents must be carriers in order to have a child with the disease.  Cystic fibrosis (CF) is one of the most common genetic conditions in persons of Caucasian ancestry.  This condition occurs in approximately 1 in 2,500 Caucasian persons and results in thickened secretions in the lungs, digestive, and reproductive systems.  For a baby to be at risk for having CF, both of the parents must be carriers for this condition.  Approximately 1 in 60 Caucasian persons is a carrier for CF.  Current carrier testing looks for the most common mutations in the gene for CF and can detect approximately 90% of carriers in the Caucasian population.  This means that the carrier screening can greatly reduce, but cannot eliminate, the chance for an individual to have a child with CF.  If an individual is found to be a carrier for CF, then carrier testing would be available for the partner. As part of [place]'s newborn screening profile, all babies born in the state of [place] will have a two-tier screening process.  Specimens are first tested to determine the concentration of immunoreactive trypsinogen (IRT).  The top 5% of specimens with the highest IRT values then undergo DNA testing using a panel of over 40 common CF mutations. SMA is a neurodegenerative disorder that leads to atrophy of skeletal muscle and overall weakness.  This  condition is also more prevalent in the Caucasian population, with 1 in 40-1 in 60 persons being a carrier and 1 in 6,000-1 in 10,000 children being affected.  There are multiple forms of the disease, with some causing death in infancy to other forms with survival into adulthood.  The genetics of SMA is complex, but carrier screening can detect up to 95% of carriers in the Caucasian population.  Similar to CF, a negative result can greatly reduce, but cannot eliminate, the chance to have a child with SMA.  We obtained a detailed family history and pregnancy history.  The father of the baby is 61 years old and has a personal and family history of type 2 diabetes.  We discussed the multifactorial inheritance of diabetes. We also reviewed that advanced paternal age is known to be associated with an increased chance for several fetal health conditions.  There is known to be an increase in single gene abnormalities in the children of men who are over age 31-50, though the specific age varies in different studies.  This chance is estimated to be no greater than 2%. These mutations may result in birth defects or genetic syndromes which may show differences on ultrasound in the second trimester.  Therefore, we recommend a detailed anatomy ultrasound at approximately [redacted] weeks gestation. A normal ultrasound cannot rule out these disorders. Advanced paternal age has also been associated with other conditions including autism spectrum disorders, schizophrenia and childhood acute lymphoblastic leukemia.  It is important to be aware of these associations, though no clinical testing is available for these conditions at this time.The remainder of the family history is unremarkable for birth defects, mental retardation, recurrent pregnancy loss or known chromosome abnormalities.  Ms. Olivia Hitch stated that this is her second pregnancy.  She and her husband have a healthy 20 year old daughter.  She reported no complications or  exposures in this pregnancy that would be expected to increase the risk for birth defects.  A review of her prenatal labs showed that she has a normal MCV, which greatly reduces the chance for her to be a carrier of thalassemia.  However, it cannot detect other hemoglobin variant including sickle cell trait.  Given her ancestry, a hemoglobin electrophoresis could be considered if desired, though we did review that it is included in newborn screening.  After consideration of the options, Olivia Maddox elected to proceed with an ultrasound only and to decline CF, SMA and hemoglobinopathy testing.  An ultrasound was performed at the time of the visit.  The gestational age was consistent with  12 weeks.  Fetal anatomy could not be assessed due to early gestational age.  Please refer to the ultrasound report for details of that study.  Ms. Olivia Hitch was encouraged to call with questions or concerns.  We can be contacted at [phone].    Cherly Anderson, MS, CGC

## 2016-04-14 NOTE — Progress Notes (Signed)
Patient refused having her blood pressure taken.  Patient states that when her blood pressure is taken her heart races and her anxiety level increases.  Dr. Tyler DeisWheeler aware and discussed with the patient.

## 2016-07-28 ENCOUNTER — Other Ambulatory Visit: Payer: Self-pay | Admitting: Family Medicine

## 2016-07-28 DIAGNOSIS — J3089 Other allergic rhinitis: Secondary | ICD-10-CM

## 2016-07-28 MED ORDER — FLUTICASONE PROPIONATE 50 MCG/ACT NA SUSP
2.0000 | Freq: Every day | NASAL | 6 refills | Status: DC
Start: 2016-07-28 — End: 2023-07-29

## 2016-07-28 NOTE — Telephone Encounter (Signed)
Pt's husband contacted office for refill request on the following medications:  1. VENTOLIN HFA 108 (90 Base) MCG/ACT inhaler  Last written: 12/24/15 with 5 refills  2. fluticasone (FLONASE) 50 MCG/ACT nasal spray Last written: 10/30/15 with 6 refills   [facility] [address]  Last OV: 12/27/15 Husband was advised Dr. Sherrie MustacheFisher is out of the office until 08/04/16 and another provider would review the request. Please advise. Thanks TNP

## 2016-08-01 ENCOUNTER — Other Ambulatory Visit: Payer: Self-pay

## 2016-08-01 DIAGNOSIS — J45909 Unspecified asthma, uncomplicated: Secondary | ICD-10-CM

## 2016-08-01 MED ORDER — FLUTICASONE PROPIONATE (INHAL) 250 MCG/BLIST IN AEPB: 2.0000 | INHALATION_SPRAY | Freq: Two times a day (BID) | RESPIRATORY_TRACT | 5 refills | Status: DC

## 2016-08-21 ENCOUNTER — Other Ambulatory Visit: Payer: Self-pay | Admitting: Family Medicine

## 2016-08-21 DIAGNOSIS — J45909 Unspecified asthma, uncomplicated: Secondary | ICD-10-CM

## 2016-08-21 NOTE — Progress Notes (Signed)
Please advise patient her insurance no longer covers flovent. Instead, they cover Qvar 40mcg 2 puffs BID. May send in rx for 1 inhaler with 5 refills after advising patient.    09/01/2016- Patients husband was advised of this in person and prescription has been sent into the pharmacy. Percell Locus-R. Chambers, CMA

## 2016-08-26 MED ORDER — BECLOMETHASONE DIPROPIONATE 40 MCG/ACT IN AERS: 2.0000 | INHALATION_SPRAY | Freq: Two times a day (BID) | RESPIRATORY_TRACT | 5 refills | Status: DC

## 2016-10-03 LAB — OB RESULTS CONSOLE GBS: GBS: NEGATIVE

## 2016-10-03 LAB — OB RESULTS CONSOLE GC/CHLAMYDIA
Chlamydia: NEGATIVE
Gonorrhea: NEGATIVE

## 2016-10-23 ENCOUNTER — Inpatient Hospital Stay
Admission: RE | Admit: 2016-10-23 | Payer: Medicaid Other | Source: Ambulatory Visit | Admitting: Obstetrics and Gynecology

## 2016-10-23 ENCOUNTER — Other Ambulatory Visit: Payer: Self-pay | Admitting: Obstetrics and Gynecology

## 2016-10-23 ENCOUNTER — Inpatient Hospital Stay
Admission: EM | Admit: 2016-10-23 | Discharge: 2016-10-27 | DRG: 765 | Disposition: A | Discharge status: Home / Self Care | Payer: Medicaid Other | Attending: Obstetrics and Gynecology | Admitting: Obstetrics and Gynecology

## 2016-10-23 ENCOUNTER — Encounter: Payer: Self-pay | Admitting: *Deleted

## 2016-10-23 DIAGNOSIS — K219 Gastro-esophageal reflux disease without esophagitis: Secondary | ICD-10-CM | POA: Diagnosis present

## 2016-10-23 DIAGNOSIS — J45909 Unspecified asthma, uncomplicated: Secondary | ICD-10-CM | POA: Diagnosis present

## 2016-10-23 DIAGNOSIS — O4103X0 Oligohydramnios, third trimester, not applicable or unspecified: Secondary | ICD-10-CM | POA: Diagnosis present

## 2016-10-23 DIAGNOSIS — O321XX0 Maternal care for breech presentation, not applicable or unspecified: Secondary | ICD-10-CM | POA: Diagnosis present

## 2016-10-23 DIAGNOSIS — O1002 Pre-existing essential hypertension complicating childbirth: Secondary | ICD-10-CM | POA: Diagnosis present

## 2016-10-23 DIAGNOSIS — O99344 Other mental disorders complicating childbirth: Secondary | ICD-10-CM | POA: Diagnosis present

## 2016-10-23 DIAGNOSIS — D62 Acute posthemorrhagic anemia: Secondary | ICD-10-CM | POA: Diagnosis not present

## 2016-10-23 DIAGNOSIS — Z8249 Family history of ischemic heart disease and other diseases of the circulatory system: Secondary | ICD-10-CM

## 2016-10-23 DIAGNOSIS — O9081 Anemia of the puerperium: Secondary | ICD-10-CM | POA: Diagnosis not present

## 2016-10-23 DIAGNOSIS — O9952 Diseases of the respiratory system complicating childbirth: Secondary | ICD-10-CM | POA: Diagnosis present

## 2016-10-23 DIAGNOSIS — Z3A39 39 weeks gestation of pregnancy: Secondary | ICD-10-CM | POA: Diagnosis not present

## 2016-10-23 DIAGNOSIS — F419 Anxiety disorder, unspecified: Secondary | ICD-10-CM | POA: Diagnosis present

## 2016-10-23 DIAGNOSIS — Z3493 Encounter for supervision of normal pregnancy, unspecified, third trimester: Secondary | ICD-10-CM

## 2016-10-23 DIAGNOSIS — O9962 Diseases of the digestive system complicating childbirth: Secondary | ICD-10-CM | POA: Diagnosis present

## 2016-10-23 LAB — CBC
HEMATOCRIT: 33.6 % — AB (ref 35.0–47.0)
HEMOGLOBIN: 11.5 g/dL — AB (ref 12.0–16.0)
MCH: 28 pg (ref 26.0–34.0)
MCHC: 34.4 g/dL (ref 32.0–36.0)
MCV: 81.6 fL (ref 80.0–100.0)
Platelets: 183 10*3/uL (ref 150–440)
RBC: 4.12 MIL/uL (ref 3.80–5.20)
RDW: 16.6 % — ABNORMAL HIGH (ref 11.5–14.5)
WBC: 9.2 10*3/uL (ref 3.6–11.0)

## 2016-10-23 LAB — BASIC METABOLIC PANEL
ANION GAP: 7 (ref 5–15)
BUN: 6 mg/dL (ref 6–20)
CALCIUM: 8.7 mg/dL — AB (ref 8.9–10.3)
CO2: 22 mmol/L (ref 22–32)
Chloride: 106 mmol/L (ref 101–111)
Creatinine, Ser: 0.43 mg/dL — ABNORMAL LOW (ref 0.44–1.00)
GLUCOSE: 83 mg/dL (ref 65–99)
POTASSIUM: 3.7 mmol/L (ref 3.5–5.1)
Sodium: 135 mmol/L (ref 135–145)

## 2016-10-23 LAB — TYPE AND SCREEN
ABO/RH(D): A POS
ANTIBODY SCREEN: NEGATIVE

## 2016-10-23 MED ORDER — ACETAMINOPHEN 650 MG RE SUPP
650.0000 mg | Freq: Once | RECTAL | Status: AC
  Administered 2016-10-24: 650 mg via RECTAL
  Filled 2016-10-23 (×3): qty 1

## 2016-10-23 MED ORDER — LACTATED RINGERS IV SOLN
INTRAVENOUS | Status: DC
  Administered 2016-10-23 (×2): 1000 mL via INTRAVENOUS

## 2016-10-23 MED ORDER — SODIUM CHLORIDE 0.9% FLUSH: 3.0000 mL | Freq: Two times a day (BID) | INTRAVENOUS | Status: DC

## 2016-10-23 MED ORDER — BUPIVACAINE LIPOSOME 1.3 % IJ SUSP
20.0000 mL | Freq: Once | INTRAMUSCULAR | Status: DC
  Filled 2016-10-23 (×4): qty 20

## 2016-10-23 MED ORDER — SOD CITRATE-CITRIC ACID 500-334 MG/5ML PO SOLN
ORAL | Status: AC
  Administered 2016-10-23: 30 mL
  Filled 2016-10-23: qty 15

## 2016-10-23 MED ORDER — CEFAZOLIN SODIUM-DEXTROSE 2-4 GM/100ML-% IV SOLN
2.0000 g | INTRAVENOUS | Status: AC
  Administered 2016-10-24: 2 g via INTRAVENOUS
  Filled 2016-10-23 (×2): qty 100

## 2016-10-23 MED ORDER — ACETAMINOPHEN 325 MG PO TABS
ORAL_TABLET | ORAL | Status: AC
  Filled 2016-10-23: qty 2

## 2016-10-23 MED ORDER — LACTATED RINGERS IV SOLN
INTRAVENOUS | Status: DC
  Administered 2016-10-24: 05:00:00 via INTRAVENOUS

## 2016-10-23 MED ORDER — SALINE SPRAY 0.65 % NA SOLN
1.0000 | NASAL | Status: DC | PRN
  Filled 2016-10-23: qty 44

## 2016-10-23 MED ORDER — BUPIVACAINE HCL 0.5 % IJ SOLN
30.0000 mL | Freq: Once | INTRAMUSCULAR | Status: DC
  Filled 2016-10-23 (×3): qty 30

## 2016-10-23 MED ORDER — ACETAMINOPHEN 325 MG PO TABS
650.0000 mg | ORAL_TABLET | Freq: Four times a day (QID) | ORAL | Status: DC | PRN
  Administered 2016-10-23: 650 mg via ORAL

## 2016-10-23 MED ORDER — SODIUM CHLORIDE 0.9 % IV SOLN: INTRAVENOUS | Status: DC

## 2016-10-23 MED ORDER — SODIUM CHLORIDE FLUSH 0.9 % IV SOLN
INTRAVENOUS | Status: AC
  Filled 2016-10-23: qty 10

## 2016-10-23 NOTE — Progress Notes (Signed)
Pt alert with family in room. Pt desired info on Ad. CH provided document but Pt did not wish to complete document now. CH is available to explain.   10/23/16 1605  Clinical Encounter Type  Visited With Patient and family together  Visit Type Initial  Referral From Nurse  Spiritual Encounters  Spiritual Needs Literature  Stress Factors  Patient Stress Factors None identified  Family Stress Factors None identified

## 2016-10-23 NOTE — H&P (Signed)
OB ADMISSION/ HISTORY & PHYSICAL:  Admission Date: 10/23/2016 12:25 PM  Admit Diagnosis: Primary Cesarean Section for breech presentation and oligohydramnios at 39+6 weeks   Olivia Maddox is a 35 y.o. female presenting for a scheduled Primary Cesarean Section for breech presentation and oligohydramnios at 39+6 weeks.  She was scheduled for a C/S on 12/26, but diagnosed with oligohydramnios on 10/22/70 with a MVP 1.7.   Prenatal History: G2P1   EDC : 10/24/2016, by Last Menstrual Period  [facility]  Prenatal course complicated by:  - History of SGA baby 2157w3d delivery, weight 2575 g (5lbs11oz),  - Anxiety  - History of Idiopathic polyhydramnios, per TJS needs monthly growth scan and weekly NST starting at 28wk - GHTN: Likely cHTN with elevated BP prior to 20wks. Not high enough for meds, but goal will be 140/90 if needed. Plan for fetal surveillance in 3rd trimester with AFI/NSTs and growth scans. Started ASA at 19 weeks with BP of 137/87; U/S 09/23/16 efw 2439 ( 26%) AFI 214mm - Breech 10/23/16 - Oligohydramnios 10/23/16  - Flu in season - declined 08/11/16 - Tdap at 27-36 weeks - given 08/11/16  Prenatal Labs: ABO, Rh: --/--/A POS (12/21 1240) Antibody: PENDING (12/21 1240) Rubella:   Immune Varicella: Immune RPR:   NR HBsAg:   Negative HIV:   Negative GTT: 82 GBS:   Negative  Medical / Surgical History :  Past medical history:  Past Medical History:  Diagnosis Date  . Asthma   . History of chicken pox      Past surgical history: No past surgical history on file.  Family History:  Family History  Problem Relation Age of Onset  . Heart disease Father   . Heart disease Brother      Social History:  reports that she has never smoked. She has never used smokeless tobacco. She reports that she does not drink alcohol or use drugs.   Allergies: Patient has no known allergies.    Current Medications at time of admission:  Prior to  Admission medications   Medication Sig Start Date End Date Taking? Authorizing Provider  beclomethasone (QVAR) 40 MCG/ACT inhaler Inhale 2 puffs into the lungs 2 (two) times daily. 08/26/16   Malva Limesonald E Fisher, MD  celecoxib (CELEBREX) 100 MG capsule Take 1 capsule (100 mg total) by mouth 2 (two) times daily as needed. For neck pain Patient not taking: Reported on 04/14/2016 11/27/15   Malva Limesonald E Fisher, MD  fluticasone [facility](FLONASE) 50 MCG/ACT nasal spray Place 2 sprays into both nostrils daily. 07/28/16   Malva Limesonald E Fisher, MD  omeprazole (PRILOSEC) 20 MG capsule TAKE 1 TABLET (20 MG TOTAL) BY MOUTH DAILY. Patient not taking: Reported on 04/14/2016 12/31/15   Malva Limesonald E Fisher, MD  VENTOLIN HFA 108 [phone](90 Base) MCG/ACT inhaler INHALE 2 PUFFS EVERY 4 TO 6 HOURS AS NEEDED FOR WHEEZING 07/28/16   Malva Limesonald E Fisher, MD     Review of Systems: Active FM BH ctxs No LOF  / SROM  No bloody show    Physical Exam:  VS: Temperature 98.2 F (36.8 C), temperature source Oral, resp. rate 20, height 5\' 9"  (1.753 m), weight 88.5 kg (195 lb), last menstrual period 01/18/2016.  General: alert and oriented, appears anxious  Heart: RRR Lungs: Clear lung fields Abdomen: Gravid, soft and non-tender, non-distended / uterus: gravid, non-tender Breech by Leopold's - confirmed breech with US by myself and Tammy Hall  Extremities: no edema  Genitalia / VE:  deferred  FHR: baseline rate 130 bpm / variability moderate / accelerations + / no decelerations TOCO: quiet    Assessment: 39+[redacted] weeks gestation Breech Oligohydramnios  FHR category 1   Plan:  1. Admit to [facility] Place for scheduled Primary LTCS for Breech presentation and oligohydramnios      - Pre-op orders in      - Ancef 2 grams prior to C/S BMI 28      - US confirmed Breech  2. GBS negative  3. Postpartum    - Contraception: unsure    - Breast  Dr. Elesa MassedWard notified of admission / plan of care  Carlean JewsMeredith Theodor Mustin, CNM

## 2016-10-23 NOTE — Anesthesia Preprocedure Evaluation (Signed)
Anesthesia Evaluation  Patient identified by MRN, date of birth, ID band Patient awake    Reviewed: Allergy & Precautions, H&P , NPO status , Patient's Chart, lab work & pertinent test results  Airway Mallampati: III  TM Distance: >3 FB Neck ROM: full    Dental  (+) Poor Dentition, Chipped   Pulmonary neg shortness of breath, asthma ,    Pulmonary exam normal breath sounds clear to auscultation       Cardiovascular Exercise Tolerance: Good hypertension, (-) angina(-) Past MI Normal cardiovascular exam Rhythm:regular Rate:Normal     Neuro/Psych    GI/Hepatic negative GI ROS, GERD  Controlled,  Endo/Other    Renal/GU   negative genitourinary   Musculoskeletal   Abdominal   Peds  Hematology negative hematology ROS (+)   Anesthesia Other Findings Past Medical History: No date: Asthma No date: History of chicken pox  No past surgical history on file.     Reproductive/Obstetrics (+) Pregnancy                             Anesthesia Physical Anesthesia Plan  ASA: III  Anesthesia Plan: Spinal   Post-op Pain Management:    Induction:   Airway Management Planned:   Additional Equipment:   Intra-op Plan:   Post-operative Plan:   Informed Consent: I have reviewed the patients History and Physical, chart, labs and discussed the procedure including the risks, benefits and alternatives for the proposed anesthesia with the patient or authorized representative who has indicated his/her understanding and acceptance.   Dental Advisory Given  Plan Discussed with: Anesthesiologist  Anesthesia Plan Comments:         Anesthesia Quick Evaluation

## 2016-10-24 ENCOUNTER — Inpatient Hospital Stay: Payer: Medicaid Other | Admitting: Anesthesiology

## 2016-10-24 ENCOUNTER — Encounter: Payer: Self-pay | Admitting: Anesthesiology

## 2016-10-24 ENCOUNTER — Other Ambulatory Visit: Payer: BLUE CROSS/BLUE SHIELD

## 2016-10-24 ENCOUNTER — Encounter
Admission: EM | Disposition: A | Discharge status: Home / Self Care | Payer: Self-pay | Source: Home / Self Care | Attending: Obstetrics and Gynecology

## 2016-10-24 DIAGNOSIS — Z3493 Encounter for supervision of normal pregnancy, unspecified, third trimester: Secondary | ICD-10-CM

## 2016-10-24 LAB — RPR: RPR: NONREACTIVE

## 2016-10-24 SURGERY — Surgical Case
Anesthesia: Spinal

## 2016-10-24 MED ORDER — PHENYLEPHRINE 40 MCG/ML (10ML) SYRINGE FOR IV PUSH (FOR BLOOD PRESSURE SUPPORT)
PREFILLED_SYRINGE | INTRAVENOUS | Status: AC
  Filled 2016-10-24: qty 20

## 2016-10-24 MED ORDER — DIPHENHYDRAMINE HCL 25 MG PO CAPS: 25.0000 mg | ORAL_CAPSULE | ORAL | Status: DC | PRN

## 2016-10-24 MED ORDER — ACETAMINOPHEN 325 MG PO TABS: 650.0000 mg | ORAL_TABLET | ORAL | Status: DC | PRN

## 2016-10-24 MED ORDER — OXYCODONE HCL 5 MG/5ML PO SOLN: 5.0000 mg | Freq: Once | ORAL | Status: DC | PRN

## 2016-10-24 MED ORDER — MENTHOL 3 MG MT LOZG: 1.0000 | LOZENGE | OROMUCOSAL | Status: DC | PRN

## 2016-10-24 MED ORDER — ONDANSETRON HCL 4 MG/2ML IJ SOLN
INTRAMUSCULAR | Status: AC
  Filled 2016-10-24: qty 2

## 2016-10-24 MED ORDER — AZITHROMYCIN 500 MG IV SOLR
500.0000 mg | INTRAVENOUS | Status: AC
  Administered 2016-10-24: 500 mg via INTRAVENOUS
  Filled 2016-10-24: qty 500

## 2016-10-24 MED ORDER — FENTANYL CITRATE (PF) 100 MCG/2ML IJ SOLN: 25.0000 ug | INTRAMUSCULAR | Status: DC | PRN

## 2016-10-24 MED ORDER — OXYCODONE-ACETAMINOPHEN 5-325 MG PO TABS
1.0000 | ORAL_TABLET | ORAL | Status: DC | PRN
  Administered 2016-10-24 – 2016-10-27 (×9): 1 via ORAL
  Filled 2016-10-24 (×13): qty 1

## 2016-10-24 MED ORDER — ALBUTEROL SULFATE (2.5 MG/3ML) 0.083% IN NEBU
2.5000 mg | INHALATION_SOLUTION | Freq: Four times a day (QID) | RESPIRATORY_TRACT | Status: DC
  Administered 2016-10-24: 2.5 mg via RESPIRATORY_TRACT
  Filled 2016-10-24: qty 3

## 2016-10-24 MED ORDER — TETANUS-DIPHTH-ACELL PERTUSSIS 5-2.5-18.5 LF-MCG/0.5 IM SUSP: 0.5000 mL | Freq: Once | INTRAMUSCULAR | Status: DC

## 2016-10-24 MED ORDER — DIPHENHYDRAMINE HCL 25 MG PO CAPS: 25.0000 mg | ORAL_CAPSULE | Freq: Four times a day (QID) | ORAL | Status: DC | PRN

## 2016-10-24 MED ORDER — OXYTOCIN 40 UNITS IN LACTATED RINGERS INFUSION - SIMPLE MED
INTRAVENOUS | Status: AC
  Filled 2016-10-24: qty 1000

## 2016-10-24 MED ORDER — SODIUM CHLORIDE 0.9 % IJ SOLN
INTRAMUSCULAR | Status: AC
  Filled 2016-10-24: qty 50

## 2016-10-24 MED ORDER — IBUPROFEN 600 MG PO TABS
600.0000 mg | ORAL_TABLET | Freq: Four times a day (QID) | ORAL | Status: DC
  Administered 2016-10-24 – 2016-10-26 (×6): 600 mg via ORAL
  Filled 2016-10-24 (×6): qty 1

## 2016-10-24 MED ORDER — MENTHOL 3 MG MT LOZG
1.0000 | LOZENGE | OROMUCOSAL | Status: DC | PRN
  Administered 2016-10-24 (×2): 3 mg via ORAL
  Filled 2016-10-24: qty 9

## 2016-10-24 MED ORDER — KETOROLAC TROMETHAMINE 30 MG/ML IJ SOLN: 30.0000 mg | Freq: Four times a day (QID) | INTRAMUSCULAR | Status: DC | PRN

## 2016-10-24 MED ORDER — MORPHINE SULFATE (PF) 0.5 MG/ML IJ SOLN
INTRAMUSCULAR | Status: DC | PRN
  Administered 2016-10-24: .2 mg via EPIDURAL

## 2016-10-24 MED ORDER — HYDROCOD POLST-CPM POLST ER 10-8 MG/5ML PO SUER: 5.0000 mL | Freq: Two times a day (BID) | ORAL | Status: DC | PRN

## 2016-10-24 MED ORDER — PHENYLEPHRINE 40 MCG/ML (10ML) SYRINGE FOR IV PUSH (FOR BLOOD PRESSURE SUPPORT)
PREFILLED_SYRINGE | INTRAVENOUS | Status: AC
  Filled 2016-10-24: qty 10

## 2016-10-24 MED ORDER — FLUTICASONE PROPIONATE 50 MCG/ACT NA SUSP
2.0000 | Freq: Every day | NASAL | Status: DC
  Filled 2016-10-24: qty 16

## 2016-10-24 MED ORDER — NALBUPHINE HCL 10 MG/ML IJ SOLN
5.0000 mg | Freq: Once | INTRAMUSCULAR | Status: AC | PRN
  Administered 2016-10-24: 5 mg via INTRAVENOUS

## 2016-10-24 MED ORDER — SIMETHICONE 80 MG PO CHEW
80.0000 mg | CHEWABLE_TABLET | ORAL | Status: DC
  Administered 2016-10-27: 80 mg via ORAL

## 2016-10-24 MED ORDER — SIMETHICONE 80 MG PO CHEW
80.0000 mg | CHEWABLE_TABLET | Freq: Three times a day (TID) | ORAL | Status: DC
  Administered 2016-10-24 – 2016-10-26 (×7): 80 mg via ORAL
  Filled 2016-10-24 (×8): qty 1

## 2016-10-24 MED ORDER — BUDESONIDE 0.25 MG/2ML IN SUSP
0.2500 mg | Freq: Two times a day (BID) | RESPIRATORY_TRACT | Status: DC
  Filled 2016-10-24: qty 2

## 2016-10-24 MED ORDER — MEASLES, MUMPS & RUBELLA VAC ~~LOC~~ INJ
0.5000 mL | INJECTION | Freq: Once | SUBCUTANEOUS | Status: DC
  Filled 2016-10-24: qty 0.5

## 2016-10-24 MED ORDER — PROPOFOL 10 MG/ML IV BOLUS
INTRAVENOUS | Status: AC
  Filled 2016-10-24: qty 20

## 2016-10-24 MED ORDER — BUPIVACAINE IN DEXTROSE 0.75-8.25 % IT SOLN
INTRATHECAL | Status: DC | PRN
  Administered 2016-10-24: 1.6 mL via INTRATHECAL

## 2016-10-24 MED ORDER — NALBUPHINE HCL 10 MG/ML IJ SOLN
INTRAMUSCULAR | Status: AC
  Administered 2016-10-24: 5 mg via INTRAVENOUS
  Filled 2016-10-24: qty 1

## 2016-10-24 MED ORDER — DIPHENHYDRAMINE HCL 50 MG/ML IJ SOLN: 12.5000 mg | INTRAMUSCULAR | Status: DC | PRN

## 2016-10-24 MED ORDER — MEPERIDINE HCL 25 MG/ML IJ SOLN: 6.2500 mg | INTRAMUSCULAR | Status: DC | PRN

## 2016-10-24 MED ORDER — BUDESONIDE 0.5 MG/2ML IN SUSP
0.5000 mg | Freq: Two times a day (BID) | RESPIRATORY_TRACT | Status: DC
  Administered 2016-10-24 – 2016-10-27 (×6): 0.5 mg via RESPIRATORY_TRACT
  Filled 2016-10-24 (×8): qty 2

## 2016-10-24 MED ORDER — SIMETHICONE 80 MG PO CHEW: 80.0000 mg | CHEWABLE_TABLET | ORAL | Status: DC | PRN

## 2016-10-24 MED ORDER — ALBUTEROL SULFATE HFA 108 (90 BASE) MCG/ACT IN AERS
INHALATION_SPRAY | RESPIRATORY_TRACT | Status: DC | PRN
  Administered 2016-10-24: 2 via RESPIRATORY_TRACT

## 2016-10-24 MED ORDER — NALBUPHINE HCL 10 MG/ML IJ SOLN: 5.0000 mg | Freq: Once | INTRAMUSCULAR | Status: AC | PRN

## 2016-10-24 MED ORDER — OXYCODONE HCL 5 MG PO TABS: 5.0000 mg | ORAL_TABLET | Freq: Once | ORAL | Status: DC | PRN

## 2016-10-24 MED ORDER — EPHEDRINE SULFATE 50 MG/ML IJ SOLN
INTRAMUSCULAR | Status: DC | PRN
  Administered 2016-10-24 (×3): 10 mg via INTRAVENOUS

## 2016-10-24 MED ORDER — PRENATAL MULTIVITAMIN CH
1.0000 | ORAL_TABLET | Freq: Every day | ORAL | Status: DC
  Administered 2016-10-24 – 2016-10-27 (×4): 1 via ORAL
  Filled 2016-10-24 (×4): qty 1

## 2016-10-24 MED ORDER — PHENYLEPHRINE HCL 10 MG/ML IJ SOLN
INTRAMUSCULAR | Status: DC | PRN
  Administered 2016-10-24 (×4): 80 ug via INTRAVENOUS
  Administered 2016-10-24: 40 ug via INTRAVENOUS
  Administered 2016-10-24: 80 ug via INTRAVENOUS
  Administered 2016-10-24 (×2): 40 ug via INTRAVENOUS
  Administered 2016-10-24 (×4): 80 ug via INTRAVENOUS

## 2016-10-24 MED ORDER — BUPIVACAINE LIPOSOME 1.3 % IJ SUSP
INTRAMUSCULAR | Status: DC | PRN
  Administered 2016-10-24: 50 mL

## 2016-10-24 MED ORDER — COCONUT OIL OIL: 1.0000 "application " | TOPICAL_OIL | Status: DC | PRN

## 2016-10-24 MED ORDER — NALOXONE HCL 0.4 MG/ML IJ SOLN: 0.4000 mg | INTRAMUSCULAR | Status: DC | PRN

## 2016-10-24 MED ORDER — ONDANSETRON HCL 4 MG/2ML IJ SOLN: 4.0000 mg | Freq: Once | INTRAMUSCULAR | Status: DC | PRN

## 2016-10-24 MED ORDER — SCOPOLAMINE 1 MG/3DAYS TD PT72: 1.0000 | MEDICATED_PATCH | Freq: Once | TRANSDERMAL | Status: DC

## 2016-10-24 MED ORDER — EPHEDRINE 5 MG/ML INJ
INTRAVENOUS | Status: AC
  Filled 2016-10-24: qty 10

## 2016-10-24 MED ORDER — NALOXONE HCL 2 MG/2ML IJ SOSY
1.0000 ug/kg/h | PREFILLED_SYRINGE | INTRAMUSCULAR | Status: DC | PRN
  Filled 2016-10-24: qty 2

## 2016-10-24 MED ORDER — SOD CITRATE-CITRIC ACID 500-334 MG/5ML PO SOLN
ORAL | Status: AC
  Administered 2016-10-24: 30 mL
  Filled 2016-10-24: qty 15

## 2016-10-24 MED ORDER — FLEET ENEMA 7-19 GM/118ML RE ENEM: 1.0000 | ENEMA | Freq: Every day | RECTAL | Status: DC | PRN

## 2016-10-24 MED ORDER — NALBUPHINE HCL 10 MG/ML IJ SOLN: 5.0000 mg | INTRAMUSCULAR | Status: DC | PRN

## 2016-10-24 MED ORDER — WITCH HAZEL-GLYCERIN EX PADS: 1.0000 "application " | MEDICATED_PAD | CUTANEOUS | Status: DC | PRN

## 2016-10-24 MED ORDER — OXYTOCIN 40 UNITS IN LACTATED RINGERS INFUSION - SIMPLE MED
INTRAVENOUS | Status: DC | PRN
  Administered 2016-10-24: 500 mL via INTRAVENOUS

## 2016-10-24 MED ORDER — PHENOL 1.4 % MT LIQD
1.0000 | OROMUCOSAL | Status: DC | PRN
  Filled 2016-10-24: qty 177

## 2016-10-24 MED ORDER — BISACODYL 10 MG RE SUPP: 10.0000 mg | Freq: Every day | RECTAL | Status: DC | PRN

## 2016-10-24 MED ORDER — SENNOSIDES-DOCUSATE SODIUM 8.6-50 MG PO TABS
2.0000 | ORAL_TABLET | ORAL | Status: DC
  Administered 2016-10-25 – 2016-10-27 (×3): 2 via ORAL
  Filled 2016-10-24 (×3): qty 2

## 2016-10-24 MED ORDER — OXYCODONE-ACETAMINOPHEN 5-325 MG PO TABS
2.0000 | ORAL_TABLET | ORAL | Status: DC | PRN
  Administered 2016-10-25 – 2016-10-27 (×4): 2 via ORAL
  Filled 2016-10-24 (×3): qty 2

## 2016-10-24 MED ORDER — LACTATED RINGERS IV SOLN
INTRAVENOUS | Status: DC
  Administered 2016-10-24 (×2): via INTRAVENOUS

## 2016-10-24 MED ORDER — MIDAZOLAM HCL 5 MG/5ML IJ SOLN
INTRAMUSCULAR | Status: DC | PRN
  Administered 2016-10-24: 2 mg via INTRAVENOUS

## 2016-10-24 MED ORDER — MORPHINE SULFATE (PF) 0.5 MG/ML IJ SOLN
INTRAMUSCULAR | Status: AC
  Filled 2016-10-24: qty 10

## 2016-10-24 MED ORDER — OXYTOCIN 40 UNITS IN LACTATED RINGERS INFUSION - SIMPLE MED
2.5000 [IU]/h | INTRAVENOUS | Status: AC
  Filled 2016-10-24: qty 1000

## 2016-10-24 MED ORDER — MIDAZOLAM HCL 2 MG/2ML IJ SOLN
INTRAMUSCULAR | Status: AC
  Filled 2016-10-24: qty 2

## 2016-10-24 MED ORDER — ONDANSETRON HCL 4 MG/2ML IJ SOLN: 4.0000 mg | Freq: Three times a day (TID) | INTRAMUSCULAR | Status: DC | PRN

## 2016-10-24 MED ORDER — ALBUTEROL SULFATE HFA 108 (90 BASE) MCG/ACT IN AERS
INHALATION_SPRAY | RESPIRATORY_TRACT | Status: AC
  Filled 2016-10-24: qty 6.7

## 2016-10-24 MED ORDER — NALBUPHINE HCL 10 MG/ML IJ SOLN
5.0000 mg | Freq: Four times a day (QID) | INTRAMUSCULAR | Status: AC | PRN
  Administered 2016-10-24: 5 mg via INTRAVENOUS
  Filled 2016-10-24: qty 1

## 2016-10-24 MED ORDER — SODIUM CHLORIDE 0.9% FLUSH: 3.0000 mL | INTRAVENOUS | Status: DC | PRN

## 2016-10-24 MED ORDER — ALBUTEROL SULFATE HFA 108 (90 BASE) MCG/ACT IN AERS: 2.0000 | INHALATION_SPRAY | Freq: Four times a day (QID) | RESPIRATORY_TRACT | Status: DC

## 2016-10-24 MED ORDER — DIBUCAINE 1 % RE OINT: 1.0000 "application " | TOPICAL_OINTMENT | RECTAL | Status: DC | PRN

## 2016-10-24 SURGICAL SUPPLY — 30 items
BARRIER ADHS 3X4 INTERCEED (GAUZE/BANDAGES/DRESSINGS) ×3 IMPLANT
CANISTER SUCT 3000ML (MISCELLANEOUS) ×3 IMPLANT
CATH KIT ON-Q SILVERSOAK 5IN (CATHETERS) IMPLANT
CHLORAPREP W/TINT 26ML (MISCELLANEOUS) ×3 IMPLANT
DRSG TELFA 3X8 NADH (GAUZE/BANDAGES/DRESSINGS) ×3 IMPLANT
ELECT REM PT RETURN 9FT ADLT (ELECTROSURGICAL) ×3
ELECTRODE REM PT RTRN 9FT ADLT (ELECTROSURGICAL) ×1 IMPLANT
GAUZE SPONGE 4X4 12PLY STRL (GAUZE/BANDAGES/DRESSINGS) ×3 IMPLANT
GOWN STRL REUS W/ TWL LRG LVL3 (GOWN DISPOSABLE) ×3 IMPLANT
GOWN STRL REUS W/TWL LRG LVL3 (GOWN DISPOSABLE) ×6
LIQUID BAND (GAUZE/BANDAGES/DRESSINGS) ×3 IMPLANT
NS IRRIG 1000ML POUR BTL (IV SOLUTION) ×3 IMPLANT
PACK C SECTION AR (MISCELLANEOUS) ×3 IMPLANT
PAD OB MATERNITY 4.3X12.25 (PERSONAL CARE ITEMS) ×3 IMPLANT
PAD PREP 24X41 OB/GYN DISP (PERSONAL CARE ITEMS) ×3 IMPLANT
SPONGE LAP 18X18 5 PK (GAUZE/BANDAGES/DRESSINGS) ×9 IMPLANT
SPONGE XRAY 4X4 16PLY STRL (MISCELLANEOUS) ×3 IMPLANT
SUT MNCRL 4-0 (SUTURE) ×2
SUT MNCRL 4-0 27XMFL (SUTURE)
SUT MNCRL 4-018XMFL (SUTURE) ×1
SUT PDS AB 1 TP1 96 (SUTURE) ×3 IMPLANT
SUT PLAIN 2 0 XLH (SUTURE) ×3 IMPLANT
SUT PLAIN GUT 2-0 30 C14 SG823 (SUTURE) ×3
SUT VIC AB 0 CT1 36 (SUTURE) ×9 IMPLANT
SUT VIC AB 1 CT1 36 (SUTURE) ×6 IMPLANT
SUT VIC AB 3-0 SH 27 (SUTURE) ×2
SUT VIC AB 3-0 SH 27X BRD (SUTURE) ×1 IMPLANT
SUTURE MNCRL 4-0 27XMF (SUTURE) IMPLANT
SUTURE MNCRL 4-018XMF (SUTURE) ×1 IMPLANT
SUTURE PLN GUT2-0 30 C14 SG823 (SUTURE) ×1 IMPLANT

## 2016-10-24 NOTE — Progress Notes (Signed)
35yo G2P1001 at 40+0wks, breech positioning. PROM at 0405 for clear fluid this morning. No contractions, Cat I strip.   The risks of cesarean section discussed with the patient included but were not limited to: bleeding which may require transfusion or reoperation; infection which may require antibiotics; injury to bowel, bladder, ureters or other surrounding organs; injury to the fetus; need for additional procedures including hysterectomy in the event of a life-threatening hemorrhage; placental abnormalities wth subsequent pregnancies, incisional problems, thromboembolic phenomenon and other postoperative/anesthesia complications. The patient concurred with the proposed plan, giving informed written consent for the procedure.   Patient has been NPO since yesterday she will remain NPO for procedure. Anesthesia and OR aware. Preoperative prophylactic antibiotics and SCDs ordered on call to the OR.  To OR when ready.  The patient has a cough. Her lungs are clear on exam, but I will give her cough medicine to inhibit postop uncontrolled coughing.   Patient ID: Olivia Maddox, female   DOB: 11-09-1980, 35 y.o.   MRN: [id number]

## 2016-10-24 NOTE — Op Note (Signed)
  Cesarean Section Procedure Note  Date of procedure: 10/24/2016   Pre-operative Diagnosis: Intrauterine pregnancy at 2938w0d; breech presentation; PROM for clear fluid; oligohydramnios at term  Post-operative Diagnosis: same, delivered.  Procedure: Primary Low Transverse Cesarean Section through Pfannenstiel incision  Surgeon: Christeen DouglasBethany Bond Grieshop, MD  Assistant(s):  Ulis RiasJamie Doman, CST  Anesthesia: Spinal anesthesia  Anesthesiologist: Berdine AddisonMathai Thomas, MD Anesthesiologist: Berdine AddisonMathai Thomas, MD CRNA: Stormy FabianLinda Curtis, CRNA; Junious SilkMark Noles, CRNA  Estimated Blood Loss:  800mL         Drains: none         Total IV Fluids: 600ml  Urine Output: 200ml         Specimens: Cord gas         Complications:  None; patient tolerated the procedure well.         Disposition: PACU - hemodynamically stable.         Condition: stable  Findings:  A female infant "Omer" in breech presentation. Amniotic fluid - Clear  Birth weigh 2910 g.  Apgars of 7 and 9 at one and five minutes respectively.  Cord gas collected: Ph 7.3 Intact placenta with a three-vessel cord.  Grossly normal uterus, tubes and ovaries bilaterally.   Indications: malpresentation: breech  Procedure Details  The patient was taken to Operating Room, identified as the correct patient and the procedure verified as C-Section Delivery. A formal Time Out was held with all team members present and in agreement.  After induction of anesthesia, the patient was draped and prepped in the usual sterile manner. A Pfannenstiel skin incision was made and carried down through the subcutaneous tissue to the fascia. Fascial incision was made and extended transversely with the Mayo scissors. The fascia was separated from the underlying rectus tissue superiorly and inferiorly. The peritoneum was identified and entered bluntly. Peritoneal incision was extended longitudinally. The utero-vesical peritoneal reflection was incised transversely and a bladder flap was  created digitally.   A low transverse hysterotomy was made. The fetus was delivered atraumatically using standard breech maneuvers. The hysterotomy was extended laterally to the left to assist with delivery of the fetal head. Time from hysterotomy to fetal head delivery was 2 minutes.The umbilical cord was clamped x2 and cut and the infant was handed to the awaiting pediatricians. The placenta was removed intact and appeared normal, intact, and with a 3-vessel cord.   The uterus was exteriorized and cleared of all clot and debris. The hysterotomy was closed with running sutures of 0-Vicryl in multiple layers due to the thickness of the segment and the extra room laterally. Bleeding was normal but repair was long. A second imbricating layer was placed with the same suture to close the final layer. Excellent hemostasis was observed. The peritoneal cavity was cleared of all clots and debris. The uterus was returned to the abdomen.   The pelvis was irrigated and again, excellent hemostasis was noted. Adhesion barriers were placed.The peritoneum was closed with 2 stiches.The fascia was then reapproximated with running sutures of 0 Vicryl.  The subcutaneous tissue was reapproximated with running sutures of 0-vicryl. The skin was reapproximated with a 4-0 Monocryl subcuticular stitch. Exparel was placed at the fascia and skin layers.   Instrument, sponge, and needle counts were correct prior to the abdominal closure and at the conclusion of the case.   The patient tolerated the procedure well and was transferred to the recovery room in stable condition.   Christeen DouglasBEASLEY, Abbas Beyene, MD 10/24/2016

## 2016-10-24 NOTE — Progress Notes (Signed)
S:  CNM notified by RN - Pt. With SROM for clear fluid at 0405 - Nitrazine positive      Pt. Comfortable, does not feel any contractions and reports good fetal movement  O:  VS: Blood pressure 113/71, pulse 75, temperature 97.5 F (36.4 C), temperature source Oral, resp. rate 20, height 5\' 9"  (1.753 m), weight 88.5 kg (195 lb), last menstrual period 01/18/2016.        FHR : baseline 120 bpm / variability moderate / accelerations + / no decelerations        Toco: UC x 1 with UI        Cervix : Dilation: 2.5 Effacement (%): Thick Station: -2 Exam by:: McSween, RN        Membranes: SROM - clear  A: Latent labor     FHR category 1  P: Notified Dr. Elesa MassedWard - she recommends monitoring for cervical change and contractions and if labor progresses with cervical dilation - will move forward with primary LTCS.  Patient was originally scheduled for 0730am.   I gave patient strict instructions to notify us if she starts having contraction pains Restart LR at 15625mL/hr Continue close monitoring  Carlean JewsMeredith Sigmon, CNM

## 2016-10-24 NOTE — Anesthesia Procedure Notes (Addendum)
Spinal  Patient location during procedure: OR Start time: 10/24/2016 8:00 AM End time: 10/24/2016 8:05 AM Staffing Anesthesiologist: Andria Frames Resident/CRNA: Doreen Salvage Performed: resident/CRNA  Preanesthetic Checklist Completed: patient identified, site marked, surgical consent, pre-op evaluation, timeout performed, IV checked, risks and benefits discussed and monitors and equipment checked Spinal Block Patient position: sitting Prep: ChloraPrep Patient monitoring: heart rate, continuous pulse ox, blood pressure and cardiac monitor Approach: midline Location: L4-5 Injection technique: single-shot Needle Needle type: Whitacre and Introducer  Needle gauge: 25 G Needle length: 9 cm Assessment Sensory level: T10 Additional Notes Negative paresthesia. Negative blood return. Positive free-flowing CSF. Expiration date of kit checked and confirmed. Patient tolerated procedure well, without complications.

## 2016-10-24 NOTE — Discharge Summary (Signed)
Obstetrical Discharge Summary  Patient Name: Olivia Maddox DOB: Mar 18, 1981 MRN: [id number]  Date of Admission: 10/23/2016 Date of Discharge:10/27/16 Primary OB: [facility]  Gestational Age at Delivery: 4361w0d   Antepartum complications: breech presenation, oligohydramnios Admitting Diagnosis: PROM, breech Secondary Diagnosis: Patient Active Problem List   Diagnosis Date Noted  . Supervision of normal pregnancy in third trimester 10/24/2016  . Advanced maternal age in multigravida 04/14/2016  . Tinnitus of right ear 12/27/2015  . GERD (gastroesophageal reflux disease) 12/27/2015  . Helicobacter pylori gastritis 11/27/2015  . Neck pain 11/27/2015  . TMJ (dislocation of temporomandibular joint) 11/27/2015  . Asthma 10/30/2015  . Allergic rhinitis 10/30/2015    Complications: None Intrapartum complications/course: Admitted with PROM in breech presenation. Oligo dx the day before. pLTCS on 10/24/16 Date of Delivery: 10/24/16 Delivered By: Christeen DouglasBEASLEY, BETHANY, MD Delivery Type: primary cesarean section, low transverse incision Anesthesia: spinal, Exparel Placenta: Spontaneous Laceration: none Episiotomy: none Newborn Data: Live born female "Omer" Birth Weight: 6 lb 6.7 oz (2910 g) APGAR: 7, 9    Discharge Physical Exam:10/27/16 BP (!) 110/57   Pulse 77   Temp 97.3 F (36.3 C) (Oral)   Resp 20   Ht 5\' 9"  (1.753 m)   Wt 195 lb (88.5 kg)   LMP 01/18/2016   SpO2 100%   Breastfeeding? Unknown   BMI 28.80 kg/m   General: NAD CV: RRR Pulm: CTABL, nl effort ABD: s/nd/nt, fundus firm and below the umbilicus Lochia: moderate Incision: c/d/i DVT Evaluation: LE non-ttp, no evidence of DVT on exam.  Hemoglobin  Date Value Ref Range Status  10/23/2016 11.5 (L) 12.0 - 16.0 g/dL Final   HCT  Date Value Ref Range Status  10/23/2016 33.6 (L) 35.0 - 47.0 % Final   Hematocrit  Date Value Ref Range Status  10/30/2015 36.7 34.0 - 46.6 % Final  POD#1:  HCT  26.8   Post partum course: uncomplicated Postpartum Procedures: none Disposition: stable, discharge to home. Baby Feeding: breastmilk Baby Disposition: home with mom   Rh Immune globulin given: n/a Rubella vaccine given: n/a Tdap vaccine given in AP or PP setting: up to date AP Flu vaccine given in AP or PP setting: declined  Contraception: Unsure of contraception type  Prenatal Labs:  ABO, Rh: --/--/A POS (12/21 1240) Antibody: PENDING (12/21 1240) Rubella:   Immune Varicella: Immune RPR:   NR HBsAg:   Negative HIV:   Negative GTT: 82 GBS:   Negative   Plan:  Olivia Maddox was discharged to home in good condition. [facility] delivering provider in2 weeks   Discharge Medications: Motrin , Percocet , Colace    Signed: Ihor Austinhomas J Hayes Czaja MD

## 2016-10-24 NOTE — Progress Notes (Signed)
S:  Pt. Still comfortable, not feeling ctxs  O:  VS: Blood pressure 113/71, pulse 75, temperature 97.5 F (36.4 C), temperature source Oral, resp. rate 20, height 5\' 9"  (1.753 m), weight 88.5 kg (195 lb), last menstrual period 01/18/2016.        FHR : baseline 120 bpm / variability moderate / accelerations + / no decelerations        Toco: contractions every 7-10 minutes / mild        Cervix : Dilation: 2.5 Effacement (%): Thick Station: -2 Exam by:: Sigmon, CNM        Membranes: SROM - clear  A: Latent labor     FHR category 1  P: Dr. Maisie Fushomas, anesthesia rounded on pt.     Dr. Elesa MassedWard and Dr. Dalbert GarnetBeasley notified of no new cervical change, plan to proceed with scheduled section at 0730     Add Azithromycin 500mg  IVPB on call to OR  Carlean JewsMeredith Sigmon, CNM

## 2016-10-24 NOTE — Transfer of Care (Signed)
Immediate Anesthesia Transfer of Care Note  Patient: Olivia Maddox  Procedure(s) Performed: Procedure(s): CESAREAN SECTION (N/A)  Patient Location: PACU  Anesthesia Type:Spinal  Level of Consciousness: awake, alert  and oriented  Airway & Oxygen Therapy: Patient Spontanous Breathing and Patient connected to face mask oxygen  Post-op Assessment: Report given to RN and Post -op Vital signs reviewed and stable  Post vital signs: Reviewed and stable  Last Vitals:  Vitals:   10/24/16 0724 10/24/16 1006  BP: 117/71 (!) 110/57  Pulse: 77   Resp:    Temp: 36.7 C 36.3 C    Complications: No apparent anesthesia complications

## 2016-10-25 DIAGNOSIS — D62 Acute posthemorrhagic anemia: Secondary | ICD-10-CM | POA: Diagnosis not present

## 2016-10-25 LAB — CBC
HEMATOCRIT: 26.8 % — AB (ref 35.0–47.0)
HEMOGLOBIN: 9.1 g/dL — AB (ref 12.0–16.0)
MCH: 28.2 pg (ref 26.0–34.0)
MCHC: 34 g/dL (ref 32.0–36.0)
MCV: 83 fL (ref 80.0–100.0)
Platelets: 161 10*3/uL (ref 150–440)
RBC: 3.23 MIL/uL — ABNORMAL LOW (ref 3.80–5.20)
RDW: 17.4 % — AB (ref 11.5–14.5)
WBC: 12.9 10*3/uL — AB (ref 3.6–11.0)

## 2016-10-25 NOTE — Progress Notes (Signed)
  Subjective:   Doing well.  No complaints. Ambulated earlier this morning in room, tolerating PO diet, tolerating pain with PO meds. Foley just removed, has not yet voided. Denies: CP SOB F/C, N/V, calf pain    Objective:  Blood pressure 109/70, pulse 99, temperature 98.1 F (36.7 C), temperature source Oral, resp. rate 16, height 5\' 9"  (1.753 m), weight 88.5 kg (195 lb), last menstrual period 01/18/2016, SpO2 97 %, unknown if currently breastfeeding.  General: NAD Pulmonary: no increased work of breathing Abdomen: non-distended, non-tender, fundus firm at level of umbilicus Incision: bandaged, c/d/i. Extremities: no edema, no erythema, no tenderness  Results for orders placed or performed during the hospital encounter of 10/23/16 (from the past 24 hour(s))  CBC     Status: Abnormal   Collection Time: 10/25/16  5:24 AM  Result Value Ref Range   WBC 12.9 (H) 3.6 - 11.0 K/uL   RBC 3.23 (L) 3.80 - 5.20 MIL/uL   Hemoglobin 9.1 (L) 12.0 - 16.0 g/dL   HCT 16.126.8 (L) 09.635.0 - 04.547.0 %   MCV 83.0 80.0 - 100.0 fL   MCH 28.2 26.0 - 34.0 pg   MCHC 34.0 32.0 - 36.0 g/dL   RDW 40.917.4 (H) 81.111.5 - 91.414.5 %   Platelets 161 150 - 440 K/uL     Assessment:   35 y.o. G2P1001 postoperativeday # 1   Plan:  1) Acute blood loss anemia - hemodynamically stable and asymptomatic - po ferrous sulfate  2) Continue routine post op cared, may shower today.  Advance diet as tolerated  3) TDAP status   4) Breast  5) Disposition - continue inpatient care

## 2016-10-26 MED ORDER — IBUPROFEN 600 MG PO TABS
600.0000 mg | ORAL_TABLET | Freq: Four times a day (QID) | ORAL | Status: DC
  Administered 2016-10-26 – 2016-10-27 (×3): 600 mg via ORAL
  Filled 2016-10-26 (×4): qty 1

## 2016-10-26 MED ORDER — BACITRACIN ZINC 500 UNIT/GM EX OINT
TOPICAL_OINTMENT | Freq: Once | CUTANEOUS | Status: AC
  Administered 2016-10-26: 17:00:00 via TOPICAL
  Filled 2016-10-26: qty 28.35

## 2016-10-26 NOTE — Anesthesia Postprocedure Evaluation (Signed)
Anesthesia Post Note  Patient: Olivia Maddox  Procedure(s) Performed: Procedure(s) (LRB): CESAREAN SECTION (N/A)  Patient location during evaluation: Mother Baby Anesthesia Type: Spinal Level of consciousness: awake, awake and alert and oriented Pain management: pain level controlled Respiratory status: spontaneous breathing Cardiovascular status: stable Postop Assessment: no headache Anesthetic complications: no     Last Vitals:  Vitals:   10/26/16 0827 10/26/16 1300  BP: 112/74   Pulse: 93   Resp: 14   Temp: 36.9 C 36.7 C    Last Pain:  Vitals:   10/26/16 1305  TempSrc:   PainSc: 3                  Daion Ginsberg,  Alison Breeding R

## 2016-10-26 NOTE — Progress Notes (Signed)
Subjective: Postpartum Day 2: Cesarean Delivery Patient reports no problems voiding.   Eating ok , pain adequately controlled  Objective: Vital signs in last 24 hours: Temp:  [98.2 F (36.8 C)-99.4 F (37.4 C)] 98.4 F (36.9 C) (12/24 0827) Pulse Rate:  [90-93] 93 (12/24 0827) Resp:  [14-20] 14 (12/24 0827) BP: (100-120)/(59-74) 112/74 (12/24 0827) SpO2:  [98 %-100 %] 100 % (12/24 0827)  Physical Exam:  General: alert and cooperative Lochia: appropriate Uterine Fundus: firm Incision: healing well DVT Evaluation: No evidence of DVT seen on physical exam.   Recent Labs  10/23/16 1243 10/25/16 0524  HGB 11.5* 9.1*  HCT 33.6* 26.8*    Assessment/Plan: Status post Cesarean section. Doing well postoperatively.  Continue current care. D/c in am  Olivia Maddox 10/26/2016, 12:12 PM

## 2016-10-27 MED ORDER — OXYCODONE-ACETAMINOPHEN 5-325 MG PO TABS: 2.0000 | ORAL_TABLET | ORAL | 0 refills | Status: DC | PRN

## 2016-10-27 MED ORDER — DOCUSATE SODIUM 100 MG PO CAPS: 100.0000 mg | ORAL_CAPSULE | Freq: Two times a day (BID) | ORAL | 0 refills | Status: DC

## 2016-10-27 MED ORDER — IBUPROFEN 600 MG PO TABS: 600.0000 mg | ORAL_TABLET | Freq: Four times a day (QID) | ORAL | 0 refills | Status: DC

## 2016-10-27 NOTE — Discharge Instructions (Signed)

## 2016-10-27 NOTE — Progress Notes (Signed)
Patient discharged home with infant. Vital signs stable, bleeding within normal limits, uterus firm. Discharge instructions, prescriptions, and follow up appointment given to and reviewed with patient. Patient verbalized understanding, all questions answered. Escorted in wheelchair by nursing. Windle Huebert, RN     

## 2016-12-26 ENCOUNTER — Other Ambulatory Visit: Payer: Self-pay | Admitting: Family Medicine

## 2016-12-26 MED ORDER — BECLOMETHASONE DIPROP HFA 40 MCG/ACT IN AERB: 2.0000 | INHALATION_SPRAY | Freq: Two times a day (BID) | RESPIRATORY_TRACT | 12 refills | Status: DC

## 2017-01-28 ENCOUNTER — Other Ambulatory Visit: Payer: Self-pay | Admitting: Family Medicine

## 2017-02-09 ENCOUNTER — Other Ambulatory Visit: Payer: Self-pay | Admitting: *Deleted

## 2017-02-09 DIAGNOSIS — J45909 Unspecified asthma, uncomplicated: Secondary | ICD-10-CM

## 2017-02-09 MED ORDER — ALBUTEROL SULFATE HFA 108 (90 BASE) MCG/ACT IN AERS: INHALATION_SPRAY | RESPIRATORY_TRACT | 11 refills | Status: DC

## 2017-08-27 IMAGING — CR DG CERVICAL SPINE COMPLETE 4+V
1 series · 7 of 7 positions shown · non-contrast
Comparison: None.

CLINICAL DATA: Neck pain, stiffness, cracking and popping with
range of motion.

EXAM:
CERVICAL SPINE - COMPLETE 4+ VIEW

[Series 1: dg cervical spine complete · 0.14mm/px · 7 of 7 slices shown]
[im 1/7]
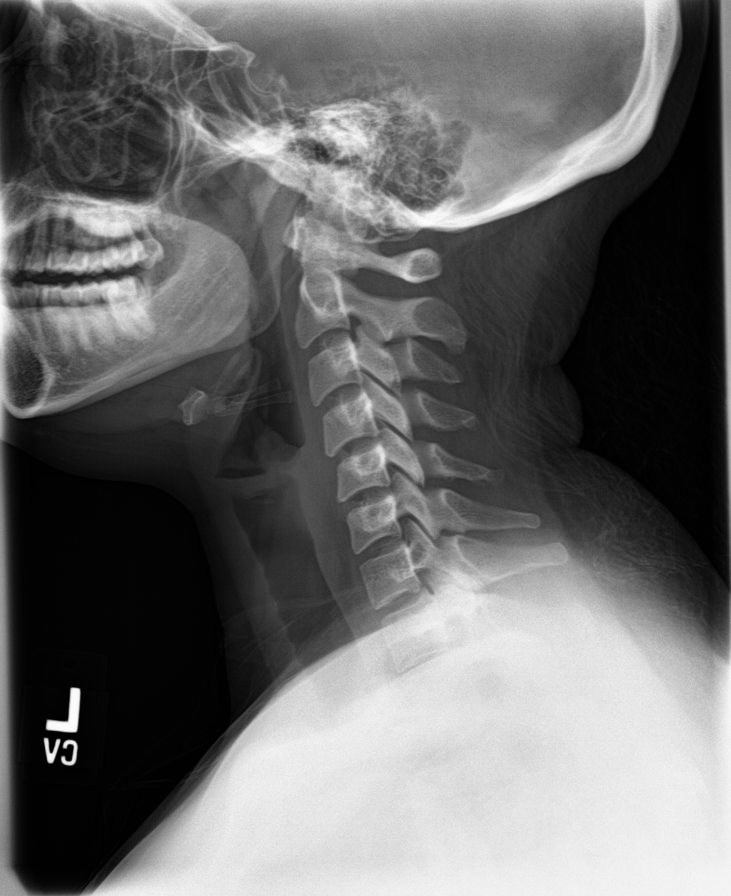
[im 2/7]
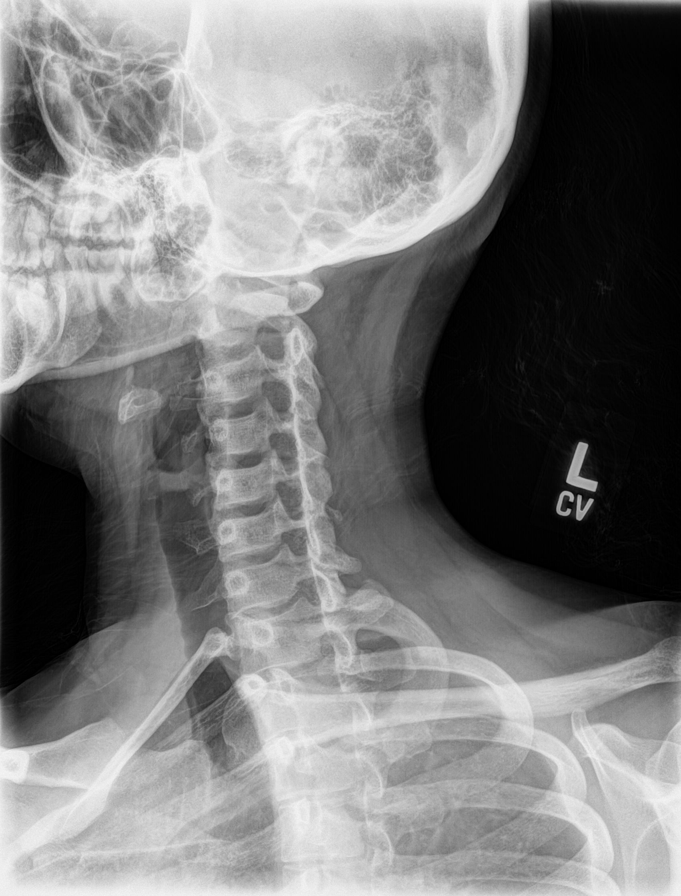
[im 3/7]
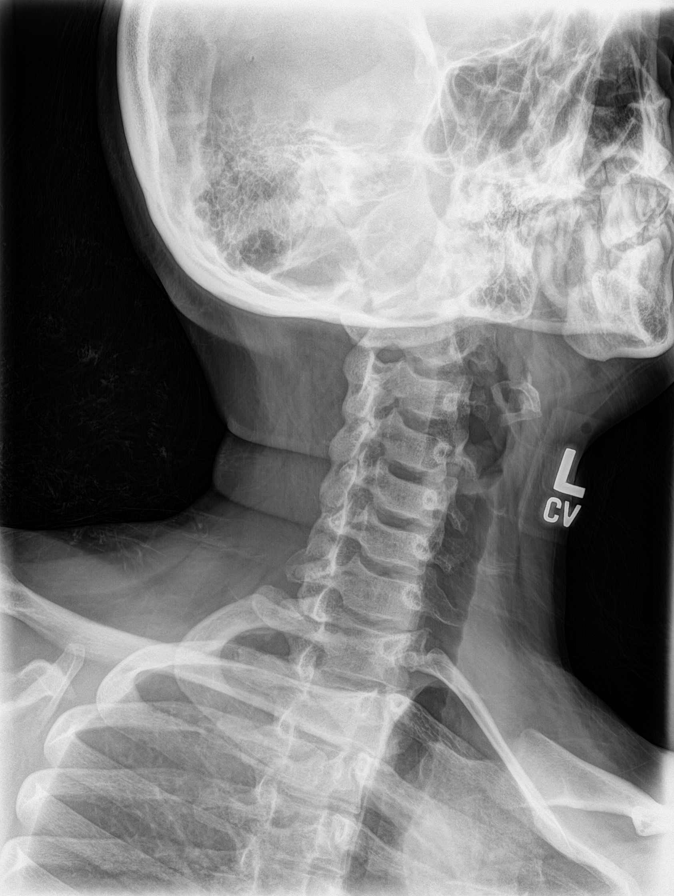
[im 4/7]
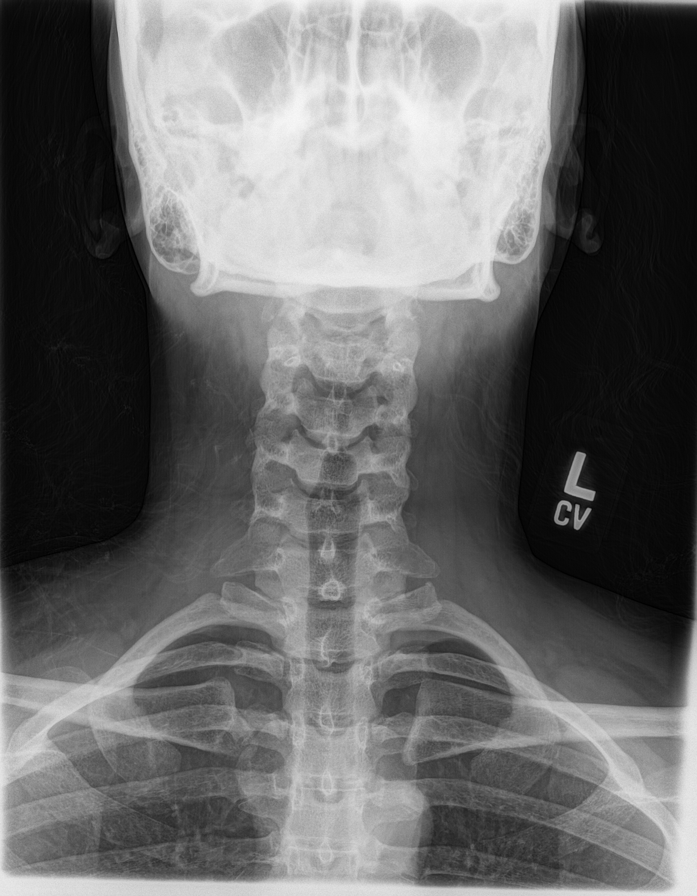
[im 5/7]
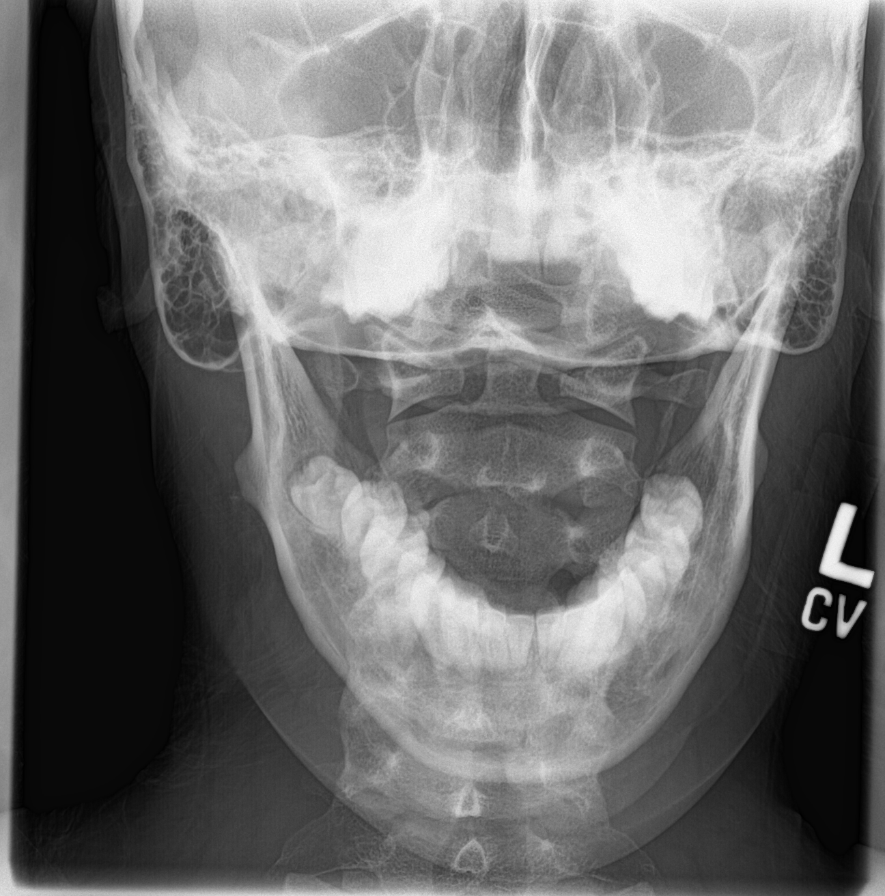
[im 6/7]
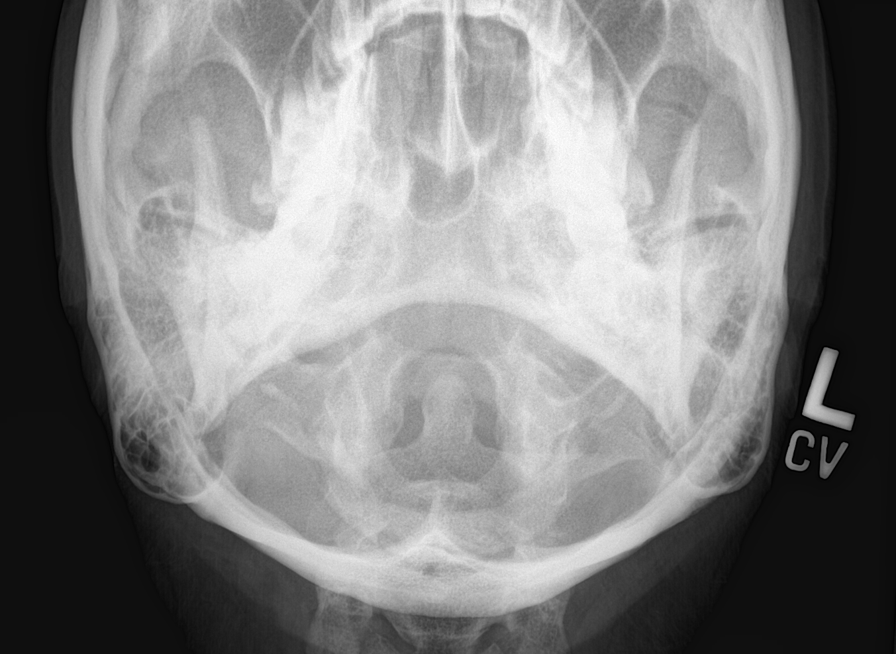
[im 7/7]
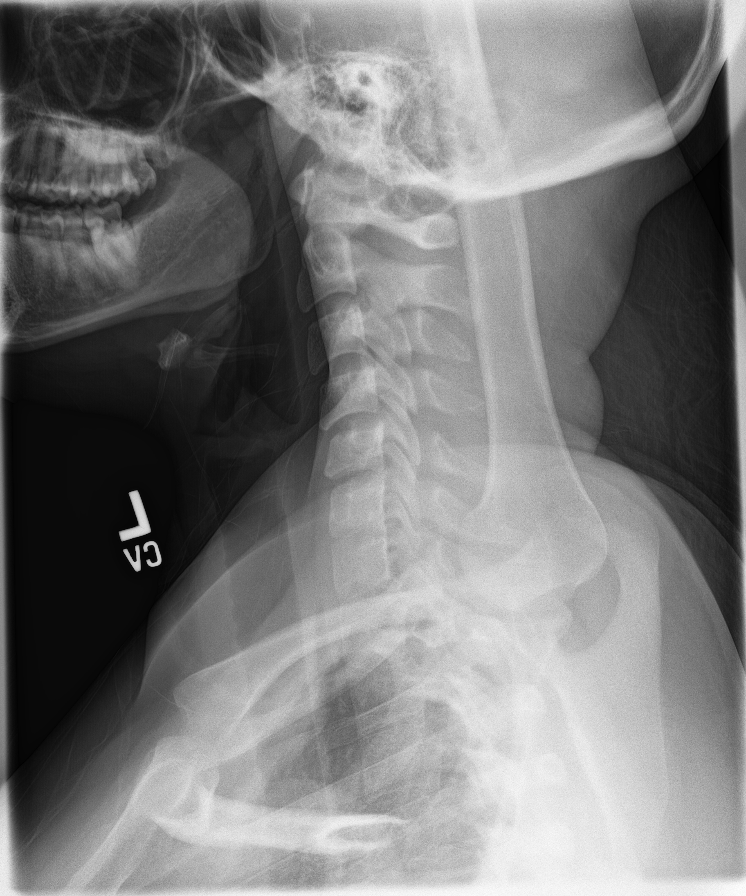

[7 of 7 positions shown; findings below may reference images not displayed]

FINDINGS: The cervical vertebral bodies are normally aligned. Disc spaces and
vertebral bodies are maintained. No significant degenerative
changes. No acute bony findings or abnormal prevertebral soft tissue
swelling. The facets are normally aligned. The neural foramen are
patent. The C1-2 articulations are maintained. Small cervical ribs
are noted. The lung apices are clear.
IMPRESSION: Normal cervical spine series.

## 2022-07-24 ENCOUNTER — Ambulatory Visit: Payer: Self-pay | Admitting: *Deleted

## 2022-07-24 NOTE — Progress Notes (Unsigned)
    Argentina Ponder [place],acting as a scribe for Lelon Huh, MD.,have documented all relevant documentation on the behalf of Lelon Huh, MD,as directed by  Lelon Huh, MD while in the presence of Lelon Huh, MD.    Established patient visit   Patient: Olivia Maddox   DOB: April 12, 1981   41 y.o. Female  MRN: [id number] Visit Date: 07/25/2022  Today's healthcare provider: Lelon Huh, MD   No chief complaint on file.  Subjective    HPI  Patient is a 41 year old female who presents for evaluation of asthma exacerbation.  Patient states she has seasonal allergies and has been using antahistimines.  She states that it got especially bad 3 days ago.  She is currently using Albuterol inhaler and reports her last dose 15 minutes prior to getting here.   Medications: Outpatient Medications Prior to Visit  Medication Sig   albuterol (PROAIR HFA) 108 (90 Base) MCG/ACT inhaler INHALE TWO PUFFS BY MOUTH EVERY 4 TO 6 HOURS AS NEEDED FOR WHEEZING   beclomethasone (QVAR) 40 MCG/ACT inhaler Inhale 2 puffs into the lungs 2 (two) times daily.   Beclomethasone Diprop HFA (QVAR REDIHALER) 40 MCG/ACT AERB Inhale 2 puffs into the lungs 2 (two) times daily.   celecoxib (CELEBREX) 100 MG capsule Take 1 capsule (100 mg total) by mouth 2 (two) times daily as needed. For neck pain (Patient not taking: Reported on 04/14/2016)   docusate sodium (COLACE) 100 MG capsule Take 1 capsule (100 mg total) by mouth 2 (two) times daily.   fluticasone (FLONASE) 50 MCG/ACT nasal spray Place 2 sprays into both nostrils daily.   ibuprofen (ADVIL,MOTRIN) 600 MG tablet Take 1 tablet (600 mg total) by mouth every 6 (six) hours.   omeprazole (PRILOSEC) 20 MG capsule TAKE 1 TABLET (20 MG TOTAL) BY MOUTH DAILY. (Patient not taking: Reported on 04/14/2016)   oxyCODONE-acetaminophen (PERCOCET/ROXICET) 5-325 MG tablet Take 2 tablets by mouth every 4 (four) hours as needed (pain scale > 7).   No facility-administered  medications prior to visit.    Review of Systems  HENT:  Positive for congestion, postnasal drip, rhinorrhea, sinus pressure, sinus pain and sneezing. Negative for ear discharge, ear pain, hearing loss and tinnitus.   Eyes:  Positive for discharge.  Respiratory:  Positive for shortness of breath. Negative for cough and wheezing.   Cardiovascular:  Negative for chest pain.    {Labs  Heme  Chem  Endocrine  Serology  Results Review (optional):23779}   Objective    There were no vitals taken for this visit. Vitals:   07/25/22 0859 07/25/22 0903  BP: (!) 165/96 (!) 146/97  Pulse: (!) 119   Temp: 99.1 F (37.3 C)   TempSrc: Oral   SpO2: 100%   Weight: 193 lb (87.5 kg)      Physical Exam  ***  No results found for any visits on 07/25/22.  Assessment & Plan     ***  No follow-ups on file.      {provider attestation***:1}   Lelon Huh, MD  [facility] [phone] (phone) [phone] (fax)  [place]

## 2022-07-24 NOTE — Telephone Encounter (Signed)
  Chief Complaint: Asthma Symptoms: More frequent asthma attacks. States inhalers help but using more frequently. Worse in mornings, wheezing. Also reports sinus drainage, allergies. Frequency: 1 week Pertinent Negatives: Patient denies  Disposition: [] ED /[] Urgent Care (no appt availability in office) / [x] Appointment(In office/virtual)/ []  [facility]/ [] Home Care/ [] Refused Recommended Disposition /[] [place] [facility]/ []  Follow-up with PCP Additional Notes: Husband calling for pt, on DPR.  Care advise provided, verbalizes understanding.  Reason for Disposition  [1] MILD asthma attack (e.g., no SOB at rest, mild SOB with walking, speaks normally in sentences, mild wheezing) AND [2] lasting > 24 hours on prescribed treatment  Answer Assessment - Initial Assessment Questions 1. RESPIRATORY STATUS: "Describe your breathing?" (e.g., wheezing, shortness of breath, unable to speak, severe coughing)      Wheezing 2. ONSET: "When did this asthma attack begin?"      1 week. Using inhalers more frequently 3. TRIGGER: "What do you think triggered this attack?" (e.g., URI, exposure to pollen or other allergen, tobacco smoke)      Sinus 4. PEAK EXPIRATORY FLOW RATE (PEFR): "Do you use a peak flow meter?" If Yes, ask: "What's the current peak flow? What's your personal best peak flow?"       5. SEVERITY: "How bad is this attack?"    - MILD: No SOB at rest, mild SOB with walking, speaks normally in sentences, can lie down, no retractions, pulse < 100. (GREEN Zone: PEFR 80-100%)   - MODERATE: SOB at rest, SOB with minimal exertion and prefers to sit, cannot lie down flat, speaks in phrases, mild retractions, audible wheezing, pulse 100-120. (YELLOW Zone: PEFR 50-79%)    - SEVERE: Struggling for each breath, speaks in single words, struggling to breathe, sitting hunched forward, retractions, usually loud wheezing, sometimes minimal wheezing because of decreased air movement, pulse > 120.  (RED Zone: PEFR < 50%).      Once she uses inhaler helps, mostly in mornings 6. ASTHMA MEDICINES:  "What treatments have you tried?"    - INHALED QUICK RELIEF (RESCUE): "What is your inhaled quick-relief medicine?" (e.g., albuterol, salbutamol) "Do you use an inhaler or a nebulizer?" "How frequently have you been using this medicine?"   - CONTROLLER (LONG-TERM-CONTROL): "Do you take an inhaled steroid? (e.g., Asmanex, Flovent, Pulmicort, Qvar)      7. INHALED QUICK-RELIEF TREATMENTS FOR THIS ATTACK: "What treatments have you given yourself so far?" and "How many and how often?" If using an inhaler, ask, "How many puffs?" Note: Routine treatments are 2 puffs every 4 hours as needed. Rescue treatments are 4 puffs repeated every 20 minutes, up to three times as needed.    Using inhalers more frequently 8. OTHER  SymPTOMS: "Do you have any other symptoms? (e.g., chest pain, coughing up yellow sputum, fever, runny nose)     Sinus drainage, cough, congestion 9. O2 SATURATION MONITOR:  "Do you use an oxygen saturation monitor (pulse oximeter) at home?" If Yes, "What is your reading (oxygen level) today?" "What is your usual oxygen saturation reading?" (e.g., 95%)  Protocols used: Asthma Attack-A-AH

## 2022-07-25 ENCOUNTER — Ambulatory Visit (INDEPENDENT_AMBULATORY_CARE_PROVIDER_SITE_OTHER): Payer: BLUE CROSS/BLUE SHIELD | Admitting: Family Medicine

## 2022-07-25 VITALS — BP 146/97 | HR 119 | Temp 99.1°F | Wt 193.0 lb

## 2022-07-25 DIAGNOSIS — J301 Allergic rhinitis due to pollen: Secondary | ICD-10-CM

## 2022-07-25 DIAGNOSIS — J45909 Unspecified asthma, uncomplicated: Secondary | ICD-10-CM

## 2022-07-25 MED ORDER — FLUTICASONE-SALMETEROL 250-50 MCG/ACT IN AEPB: 1.0000 | INHALATION_SPRAY | Freq: Two times a day (BID) | RESPIRATORY_TRACT | 5 refills | Status: DC

## 2022-07-25 MED ORDER — ALBUTEROL SULFATE HFA 108 (90 BASE) MCG/ACT IN AERS: 2.0000 | INHALATION_SPRAY | Freq: Four times a day (QID) | RESPIRATORY_TRACT | 3 refills | Status: DC | PRN

## 2022-07-25 MED ORDER — MONTELUKAST SODIUM 10 MG PO TABS: 10.0000 mg | ORAL_TABLET | Freq: Every day | ORAL | 12 refills | Status: AC

## 2022-12-12 ENCOUNTER — Other Ambulatory Visit: Payer: Self-pay | Admitting: Family Medicine

## 2022-12-12 DIAGNOSIS — J45909 Unspecified asthma, uncomplicated: Secondary | ICD-10-CM

## 2022-12-12 MED ORDER — FLUTICASONE-SALMETEROL 250-50 MCG/ACT IN AEPB: 1.0000 | INHALATION_SPRAY | Freq: Two times a day (BID) | RESPIRATORY_TRACT | 5 refills | Status: DC

## 2023-04-16 ENCOUNTER — Other Ambulatory Visit: Payer: Self-pay | Admitting: Family Medicine

## 2023-04-16 DIAGNOSIS — J45909 Unspecified asthma, uncomplicated: Secondary | ICD-10-CM

## 2023-06-23 ENCOUNTER — Other Ambulatory Visit: Payer: Self-pay | Admitting: Family Medicine

## 2023-06-23 DIAGNOSIS — J45909 Unspecified asthma, uncomplicated: Secondary | ICD-10-CM

## 2023-07-28 ENCOUNTER — Other Ambulatory Visit: Payer: Self-pay | Admitting: Family Medicine

## 2023-07-28 DIAGNOSIS — J45909 Unspecified asthma, uncomplicated: Secondary | ICD-10-CM

## 2023-10-06 ENCOUNTER — Other Ambulatory Visit: Payer: Self-pay | Admitting: Family Medicine

## 2023-10-06 ENCOUNTER — Telehealth: Payer: Self-pay | Admitting: Family Medicine

## 2023-10-06 DIAGNOSIS — J45909 Unspecified asthma, uncomplicated: Secondary | ICD-10-CM

## 2023-10-06 NOTE — Telephone Encounter (Signed)
pharmacy faxed refill request for the following medications:    VENTOLIN HFA 108 (90 Base) MCG/ACT inhaler   Please advise

## 2023-10-07 MED ORDER — VENTOLIN HFA 108 (90 BASE) MCG/ACT IN AERS: INHALATION_SPRAY | RESPIRATORY_TRACT | 0 refills | Status: DC

## 2023-10-07 NOTE — Telephone Encounter (Signed)
Have sent refill, please advise is due for follow up office visit.

## 2023-10-07 NOTE — Telephone Encounter (Signed)
Left voicemail for patient to call to schedule an appointment before any more meds can be refilled.

## 2023-12-08 ENCOUNTER — Other Ambulatory Visit: Payer: Self-pay | Admitting: Family Medicine

## 2023-12-08 DIAGNOSIS — J45909 Unspecified asthma, uncomplicated: Secondary | ICD-10-CM

## 2024-03-16 ENCOUNTER — Other Ambulatory Visit (HOSPITAL_COMMUNITY): Payer: Self-pay

## 2024-06-03 ENCOUNTER — Telehealth: Payer: Self-pay | Admitting: Family Medicine

## 2024-06-03 DIAGNOSIS — J45909 Unspecified asthma, uncomplicated: Secondary | ICD-10-CM

## 2024-06-04 NOTE — Telephone Encounter (Signed)
 Have sent 30 day refill, but patient not seen since 2023 and needs to schedule appointment this month

## 2024-06-28 ENCOUNTER — Other Ambulatory Visit: Payer: Self-pay | Admitting: Family Medicine

## 2024-06-28 DIAGNOSIS — J45909 Unspecified asthma, uncomplicated: Secondary | ICD-10-CM

## 2024-06-29 NOTE — Telephone Encounter (Signed)
 Per Dr. Lyle last refill approval- patient will need appt for further refills. Has not been seen since 2023. Have attempted to reach patient and husband with no success (message says number is not in service for both.) Denying this request for now, if patient calls back to ask why this was denied. PLEASE advise patient that they need an appointment for further refills.
